# Patient Record
Sex: Male | Born: 1950 | Race: White | Hispanic: No
Health system: Southern US, Academic
[De-identification: ages and names within clinical notes are randomized; demographics above are authoritative.]

## PROBLEM LIST (undated history)

## (undated) DIAGNOSIS — J449 Chronic obstructive pulmonary disease, unspecified: Secondary | ICD-10-CM

## (undated) DIAGNOSIS — M1990 Unspecified osteoarthritis, unspecified site: Secondary | ICD-10-CM

## (undated) DIAGNOSIS — E039 Hypothyroidism, unspecified: Secondary | ICD-10-CM

## (undated) DIAGNOSIS — K759 Inflammatory liver disease, unspecified: Secondary | ICD-10-CM

## (undated) HISTORY — PX: LAMINECTOMY: SHX219

## (undated) HISTORY — PX: TONSILLECTOMY: SUR1361

## (undated) HISTORY — PX: SPINAL FUSION: SHX223

---

## 1997-04-28 ENCOUNTER — Other Ambulatory Visit (HOSPITAL_COMMUNITY): Payer: Self-pay | Admitting: NEUROSURGERY

## 1998-10-01 ENCOUNTER — Emergency Department (HOSPITAL_COMMUNITY): Admission: EM | Admit: 1998-10-01 | Discharge: 1998-10-01 | Payer: Self-pay | Admitting: Emergency Medicine

## 1998-12-09 ENCOUNTER — Emergency Department (HOSPITAL_COMMUNITY): Admission: EM | Admit: 1998-12-09 | Discharge: 1998-12-09 | Payer: Self-pay | Admitting: Emergency Medicine

## 1998-12-10 ENCOUNTER — Emergency Department (HOSPITAL_COMMUNITY): Admission: EM | Admit: 1998-12-10 | Discharge: 1998-12-10 | Payer: Self-pay | Admitting: *Deleted

## 2000-05-21 HISTORY — PX: ELBOW SURGERY: SHX618

## 2000-07-06 ENCOUNTER — Emergency Department (HOSPITAL_COMMUNITY): Payer: Self-pay | Admitting: Emergency Medicine

## 2001-01-22 ENCOUNTER — Emergency Department (HOSPITAL_COMMUNITY): Admission: EM | Admit: 2001-01-22 | Discharge: 2001-01-22 | Payer: Self-pay | Admitting: Emergency Medicine

## 2002-01-29 ENCOUNTER — Encounter: Payer: Self-pay | Admitting: Emergency Medicine

## 2002-01-29 ENCOUNTER — Inpatient Hospital Stay (HOSPITAL_COMMUNITY): Admission: EM | Admit: 2002-01-29 | Discharge: 2002-01-30 | Payer: Self-pay | Admitting: Emergency Medicine

## 2002-01-30 ENCOUNTER — Encounter: Payer: Self-pay | Admitting: Internal Medicine

## 2002-02-06 ENCOUNTER — Emergency Department (HOSPITAL_COMMUNITY): Admission: EM | Admit: 2002-02-06 | Discharge: 2002-02-06 | Payer: Self-pay | Admitting: Emergency Medicine

## 2002-02-06 ENCOUNTER — Encounter: Payer: Self-pay | Admitting: Emergency Medicine

## 2002-03-01 ENCOUNTER — Encounter: Payer: Self-pay | Admitting: *Deleted

## 2002-03-01 ENCOUNTER — Inpatient Hospital Stay (HOSPITAL_COMMUNITY): Admission: EM | Admit: 2002-03-01 | Discharge: 2002-03-03 | Payer: Self-pay | Admitting: *Deleted

## 2002-03-02 ENCOUNTER — Encounter: Payer: Self-pay | Admitting: Internal Medicine

## 2002-03-03 ENCOUNTER — Inpatient Hospital Stay (HOSPITAL_COMMUNITY): Admission: EM | Admit: 2002-03-03 | Discharge: 2002-03-08 | Payer: Self-pay | Admitting: Psychiatry

## 2002-09-07 ENCOUNTER — Encounter: Payer: Self-pay | Admitting: Family Medicine

## 2002-09-07 ENCOUNTER — Ambulatory Visit (HOSPITAL_COMMUNITY): Admission: RE | Admit: 2002-09-07 | Discharge: 2002-09-07 | Payer: Self-pay | Admitting: Family Medicine

## 2002-09-12 ENCOUNTER — Emergency Department (HOSPITAL_COMMUNITY): Admission: EM | Admit: 2002-09-12 | Discharge: 2002-09-12 | Payer: Self-pay | Admitting: Emergency Medicine

## 2002-12-29 ENCOUNTER — Encounter: Payer: Self-pay | Admitting: Neurosurgery

## 2002-12-29 ENCOUNTER — Inpatient Hospital Stay (HOSPITAL_COMMUNITY): Admission: RE | Admit: 2002-12-29 | Discharge: 2003-01-02 | Payer: Self-pay | Admitting: Neurosurgery

## 2003-02-08 ENCOUNTER — Ambulatory Visit (HOSPITAL_COMMUNITY): Admission: RE | Admit: 2003-02-08 | Discharge: 2003-02-08 | Payer: Self-pay | Admitting: Family Medicine

## 2003-02-08 ENCOUNTER — Encounter: Payer: Self-pay | Admitting: Family Medicine

## 2003-05-08 ENCOUNTER — Emergency Department (HOSPITAL_COMMUNITY): Admission: EM | Admit: 2003-05-08 | Discharge: 2003-05-08 | Payer: Self-pay | Admitting: Emergency Medicine

## 2003-09-29 ENCOUNTER — Ambulatory Visit (HOSPITAL_COMMUNITY): Admission: RE | Admit: 2003-09-29 | Discharge: 2003-09-29 | Payer: Self-pay | Admitting: Family Medicine

## 2003-12-28 ENCOUNTER — Ambulatory Visit (HOSPITAL_COMMUNITY): Admission: RE | Admit: 2003-12-28 | Discharge: 2003-12-28 | Payer: Self-pay | Admitting: Orthopedic Surgery

## 2003-12-30 ENCOUNTER — Encounter (HOSPITAL_COMMUNITY): Admission: RE | Admit: 2003-12-30 | Discharge: 2004-01-29 | Payer: Self-pay | Admitting: Orthopedic Surgery

## 2004-01-28 ENCOUNTER — Emergency Department (HOSPITAL_COMMUNITY): Admission: EM | Admit: 2004-01-28 | Discharge: 2004-01-28 | Payer: Self-pay | Admitting: Emergency Medicine

## 2004-03-11 ENCOUNTER — Emergency Department (HOSPITAL_COMMUNITY): Admission: EM | Admit: 2004-03-11 | Discharge: 2004-03-11 | Payer: Self-pay | Admitting: Emergency Medicine

## 2004-05-08 ENCOUNTER — Ambulatory Visit (HOSPITAL_COMMUNITY): Admission: RE | Admit: 2004-05-08 | Discharge: 2004-05-08 | Payer: Self-pay | Admitting: Family Medicine

## 2004-07-10 ENCOUNTER — Emergency Department (HOSPITAL_COMMUNITY): Admission: EM | Admit: 2004-07-10 | Discharge: 2004-07-10 | Payer: Self-pay | Admitting: Emergency Medicine

## 2004-07-12 ENCOUNTER — Ambulatory Visit: Payer: Self-pay | Admitting: Orthopedic Surgery

## 2004-07-14 ENCOUNTER — Ambulatory Visit: Payer: Self-pay | Admitting: Orthopedic Surgery

## 2004-07-14 ENCOUNTER — Ambulatory Visit (HOSPITAL_COMMUNITY): Admission: RE | Admit: 2004-07-14 | Discharge: 2004-07-14 | Payer: Self-pay | Admitting: Orthopedic Surgery

## 2004-07-17 ENCOUNTER — Ambulatory Visit: Payer: Self-pay | Admitting: Orthopedic Surgery

## 2004-07-27 ENCOUNTER — Ambulatory Visit: Payer: Self-pay | Admitting: Orthopedic Surgery

## 2004-07-30 ENCOUNTER — Emergency Department (HOSPITAL_COMMUNITY): Admission: EM | Admit: 2004-07-30 | Discharge: 2004-07-30 | Payer: Self-pay | Admitting: Emergency Medicine

## 2004-08-05 ENCOUNTER — Emergency Department (HOSPITAL_COMMUNITY): Admission: EM | Admit: 2004-08-05 | Discharge: 2004-08-05 | Payer: Self-pay | Admitting: Emergency Medicine

## 2004-08-24 ENCOUNTER — Ambulatory Visit: Payer: Self-pay | Admitting: Orthopedic Surgery

## 2004-10-10 ENCOUNTER — Encounter (HOSPITAL_COMMUNITY): Admission: RE | Admit: 2004-10-10 | Discharge: 2004-11-09 | Payer: Self-pay | Admitting: Family Medicine

## 2004-11-13 ENCOUNTER — Emergency Department (HOSPITAL_COMMUNITY): Admission: EM | Admit: 2004-11-13 | Discharge: 2004-11-13 | Payer: Self-pay | Admitting: Emergency Medicine

## 2004-11-14 ENCOUNTER — Ambulatory Visit: Payer: Self-pay | Admitting: Internal Medicine

## 2004-11-16 ENCOUNTER — Ambulatory Visit (HOSPITAL_COMMUNITY): Admission: RE | Admit: 2004-11-16 | Discharge: 2004-11-16 | Payer: Self-pay | Admitting: Internal Medicine

## 2004-11-20 ENCOUNTER — Ambulatory Visit: Payer: Self-pay | Admitting: Orthopedic Surgery

## 2004-11-30 ENCOUNTER — Ambulatory Visit (HOSPITAL_COMMUNITY): Admission: RE | Admit: 2004-11-30 | Discharge: 2004-11-30 | Payer: Self-pay | Admitting: Internal Medicine

## 2004-11-30 ENCOUNTER — Encounter (INDEPENDENT_AMBULATORY_CARE_PROVIDER_SITE_OTHER): Payer: Self-pay | Admitting: Internal Medicine

## 2004-11-30 ENCOUNTER — Ambulatory Visit: Payer: Self-pay | Admitting: Internal Medicine

## 2004-12-03 ENCOUNTER — Emergency Department (HOSPITAL_COMMUNITY): Admission: EM | Admit: 2004-12-03 | Discharge: 2004-12-03 | Payer: Self-pay | Admitting: Emergency Medicine

## 2004-12-31 ENCOUNTER — Emergency Department (HOSPITAL_COMMUNITY): Admission: EM | Admit: 2004-12-31 | Discharge: 2004-12-31 | Payer: Self-pay | Admitting: Emergency Medicine

## 2005-01-04 ENCOUNTER — Ambulatory Visit: Payer: Self-pay | Admitting: Internal Medicine

## 2005-02-11 ENCOUNTER — Emergency Department (HOSPITAL_COMMUNITY): Admission: EM | Admit: 2005-02-11 | Discharge: 2005-02-11 | Payer: Self-pay | Admitting: Emergency Medicine

## 2005-04-04 ENCOUNTER — Ambulatory Visit (HOSPITAL_COMMUNITY): Admission: RE | Admit: 2005-04-04 | Discharge: 2005-04-04 | Payer: Self-pay | Admitting: Family Medicine

## 2005-04-11 ENCOUNTER — Ambulatory Visit: Payer: Self-pay | Admitting: Internal Medicine

## 2005-04-11 ENCOUNTER — Emergency Department (HOSPITAL_COMMUNITY): Admission: EM | Admit: 2005-04-11 | Discharge: 2005-04-11 | Payer: Self-pay | Admitting: Emergency Medicine

## 2005-04-15 ENCOUNTER — Emergency Department (HOSPITAL_COMMUNITY): Admission: EM | Admit: 2005-04-15 | Discharge: 2005-04-15 | Payer: Self-pay | Admitting: Emergency Medicine

## 2005-05-25 ENCOUNTER — Emergency Department (HOSPITAL_COMMUNITY): Admission: EM | Admit: 2005-05-25 | Discharge: 2005-05-25 | Payer: Self-pay | Admitting: Emergency Medicine

## 2005-06-21 ENCOUNTER — Ambulatory Visit: Payer: Self-pay | Admitting: Internal Medicine

## 2005-06-30 ENCOUNTER — Emergency Department (HOSPITAL_COMMUNITY): Admission: EM | Admit: 2005-06-30 | Discharge: 2005-06-30 | Payer: Self-pay | Admitting: Emergency Medicine

## 2005-07-12 ENCOUNTER — Emergency Department (HOSPITAL_COMMUNITY): Admission: EM | Admit: 2005-07-12 | Discharge: 2005-07-12 | Payer: Self-pay | Admitting: Emergency Medicine

## 2005-07-13 ENCOUNTER — Ambulatory Visit: Payer: Self-pay | Admitting: Gastroenterology

## 2005-07-15 ENCOUNTER — Emergency Department (HOSPITAL_COMMUNITY): Admission: EM | Admit: 2005-07-15 | Discharge: 2005-07-15 | Payer: Self-pay | Admitting: Emergency Medicine

## 2005-07-25 ENCOUNTER — Ambulatory Visit: Payer: Self-pay | Admitting: Internal Medicine

## 2005-08-06 ENCOUNTER — Ambulatory Visit: Payer: Self-pay | Admitting: Internal Medicine

## 2005-08-15 ENCOUNTER — Ambulatory Visit: Payer: Self-pay | Admitting: Internal Medicine

## 2005-09-13 ENCOUNTER — Ambulatory Visit: Payer: Self-pay | Admitting: Internal Medicine

## 2005-09-27 ENCOUNTER — Ambulatory Visit: Payer: Self-pay | Admitting: Internal Medicine

## 2005-10-26 ENCOUNTER — Emergency Department (HOSPITAL_COMMUNITY): Admission: EM | Admit: 2005-10-26 | Discharge: 2005-10-26 | Payer: Self-pay | Admitting: Emergency Medicine

## 2005-11-02 ENCOUNTER — Ambulatory Visit (HOSPITAL_COMMUNITY): Admission: RE | Admit: 2005-11-02 | Discharge: 2005-11-02 | Payer: Self-pay | Admitting: Family Medicine

## 2005-12-16 ENCOUNTER — Emergency Department (HOSPITAL_COMMUNITY): Admission: EM | Admit: 2005-12-16 | Discharge: 2005-12-16 | Payer: Self-pay | Admitting: Emergency Medicine

## 2006-01-16 ENCOUNTER — Ambulatory Visit: Payer: Self-pay | Admitting: Internal Medicine

## 2006-01-25 ENCOUNTER — Emergency Department (HOSPITAL_COMMUNITY): Admission: EM | Admit: 2006-01-25 | Discharge: 2006-01-25 | Payer: Self-pay | Admitting: Emergency Medicine

## 2006-03-08 ENCOUNTER — Emergency Department (HOSPITAL_COMMUNITY): Admission: EM | Admit: 2006-03-08 | Discharge: 2006-03-08 | Payer: Self-pay | Admitting: Emergency Medicine

## 2006-03-11 ENCOUNTER — Ambulatory Visit (HOSPITAL_COMMUNITY): Admission: RE | Admit: 2006-03-11 | Discharge: 2006-03-11 | Payer: Self-pay | Admitting: Family Medicine

## 2006-04-09 ENCOUNTER — Ambulatory Visit (HOSPITAL_COMMUNITY): Admission: RE | Admit: 2006-04-09 | Discharge: 2006-04-09 | Payer: Self-pay | Admitting: Neurosurgery

## 2006-05-02 ENCOUNTER — Inpatient Hospital Stay (HOSPITAL_COMMUNITY): Admission: RE | Admit: 2006-05-02 | Discharge: 2006-05-03 | Payer: Self-pay | Admitting: Neurosurgery

## 2006-06-17 ENCOUNTER — Emergency Department (HOSPITAL_COMMUNITY): Admission: EM | Admit: 2006-06-17 | Discharge: 2006-06-17 | Payer: Self-pay | Admitting: Emergency Medicine

## 2006-08-01 ENCOUNTER — Ambulatory Visit: Payer: Self-pay | Admitting: Internal Medicine

## 2006-11-30 ENCOUNTER — Emergency Department (HOSPITAL_COMMUNITY): Admission: EM | Admit: 2006-11-30 | Discharge: 2006-11-30 | Payer: Self-pay | Admitting: Emergency Medicine

## 2007-01-09 ENCOUNTER — Observation Stay (HOSPITAL_COMMUNITY): Admission: EM | Admit: 2007-01-09 | Discharge: 2007-01-10 | Payer: Self-pay | Admitting: Emergency Medicine

## 2007-01-14 ENCOUNTER — Ambulatory Visit: Payer: Self-pay | Admitting: Internal Medicine

## 2007-03-20 ENCOUNTER — Emergency Department (HOSPITAL_COMMUNITY): Admission: EM | Admit: 2007-03-20 | Discharge: 2007-03-20 | Payer: Self-pay | Admitting: Emergency Medicine

## 2007-04-30 ENCOUNTER — Ambulatory Visit (HOSPITAL_COMMUNITY): Admission: RE | Admit: 2007-04-30 | Discharge: 2007-04-30 | Payer: Self-pay | Admitting: Family Medicine

## 2007-05-02 ENCOUNTER — Ambulatory Visit (HOSPITAL_COMMUNITY): Admission: RE | Admit: 2007-05-02 | Discharge: 2007-05-02 | Payer: Self-pay | Admitting: Family Medicine

## 2007-05-20 ENCOUNTER — Emergency Department (HOSPITAL_COMMUNITY): Admission: EM | Admit: 2007-05-20 | Discharge: 2007-05-20 | Payer: Self-pay | Admitting: Emergency Medicine

## 2007-06-06 ENCOUNTER — Inpatient Hospital Stay (HOSPITAL_COMMUNITY): Admission: EM | Admit: 2007-06-06 | Discharge: 2007-06-13 | Payer: Self-pay | Admitting: Emergency Medicine

## 2007-06-14 ENCOUNTER — Emergency Department (HOSPITAL_COMMUNITY): Admission: EM | Admit: 2007-06-14 | Discharge: 2007-06-14 | Payer: Self-pay | Admitting: Emergency Medicine

## 2007-08-30 ENCOUNTER — Emergency Department (HOSPITAL_COMMUNITY): Admission: EM | Admit: 2007-08-30 | Discharge: 2007-08-30 | Payer: Self-pay | Admitting: Emergency Medicine

## 2007-10-07 ENCOUNTER — Ambulatory Visit: Payer: Self-pay | Admitting: Internal Medicine

## 2007-10-24 ENCOUNTER — Emergency Department (HOSPITAL_COMMUNITY): Admission: EM | Admit: 2007-10-24 | Discharge: 2007-10-24 | Payer: Self-pay | Admitting: Emergency Medicine

## 2007-12-14 ENCOUNTER — Emergency Department (HOSPITAL_COMMUNITY): Admission: EM | Admit: 2007-12-14 | Discharge: 2007-12-14 | Payer: Self-pay | Admitting: Emergency Medicine

## 2007-12-28 ENCOUNTER — Emergency Department (HOSPITAL_COMMUNITY): Admission: EM | Admit: 2007-12-28 | Discharge: 2007-12-28 | Payer: Self-pay | Admitting: Emergency Medicine

## 2008-01-03 ENCOUNTER — Emergency Department (HOSPITAL_COMMUNITY): Admission: EM | Admit: 2008-01-03 | Discharge: 2008-01-03 | Payer: Self-pay | Admitting: Emergency Medicine

## 2008-01-18 ENCOUNTER — Emergency Department (HOSPITAL_COMMUNITY): Admission: EM | Admit: 2008-01-18 | Discharge: 2008-01-18 | Payer: Self-pay | Admitting: Emergency Medicine

## 2008-03-05 ENCOUNTER — Emergency Department (HOSPITAL_COMMUNITY): Admission: EM | Admit: 2008-03-05 | Discharge: 2008-03-05 | Payer: Self-pay | Admitting: Emergency Medicine

## 2008-05-01 ENCOUNTER — Emergency Department (HOSPITAL_COMMUNITY): Admission: EM | Admit: 2008-05-01 | Discharge: 2008-05-01 | Payer: Self-pay | Admitting: Emergency Medicine

## 2009-11-10 ENCOUNTER — Encounter (INDEPENDENT_AMBULATORY_CARE_PROVIDER_SITE_OTHER): Payer: Self-pay

## 2010-06-10 ENCOUNTER — Encounter: Payer: Self-pay | Admitting: Neurosurgery

## 2010-06-20 NOTE — Letter (Signed)
Summary: Recall Colonoscopy/Endoscopy, Change to Office Visit  [facility]  [address]   [place], [place] [address]   Phone: [phone]  Fax: [phone]      November 10, 2009   Samuel [name] [address] [place], [place]  [address] Nov 16, 1950   Dear Mr. Semel,   According to our records, it is time for you to schedule a Colonoscopy/Endoscopy. However, after reviewing your medical record, we recommend an office visit in order to determine your need for a repeat procedure.  Please call [phone] at your convenience to schedule an office visit. If you have any questions or concerns, please feel free to contact our office.   Sincerely,   Cloria Spring LPN  [facility] Ph: [phone]   Fax: [phone]  Appended Document: Recall Colonoscopy/Endoscopy, Change to Office Visit Letter returned, attempted, unknown.

## 2010-10-03 NOTE — Assessment & Plan Note (Signed)
[name], [name]                    CHART#:  [id number]   DATE:  01/14/2007                       DOB:  06-24-1950   1. Follow up history of HCV genotype 3 status post 1 month or so      combination therapy early 2007 with a sustained virologic response      as evidenced by negative quantitative PCR in March 2008.  2. History of anemia, probably secondary to antiviral therapy.  3. Interim improvement in FibroSure score pre and post treatment.  4. History of adenomatous polyp left colon 2007.  He will be due for      followup exam in 4 years.  He has also positive family history of      colon cancer.   Mr. Samuel [name] returns today.  He had cervical spine surgery back in December  2007.  He has done fairly well.  He did have LFTs done back in March and  they were all completely normal, along with a PCR.  Discussed the  implications of a negative PCR 1 year after therapy and it is impressive  to note that he appears to have eradicated the infection with a  relatively brief period of antiviral therapy.  He does not drink  alcohol.   CURRENT MEDICATIONS:  Are reviewed.  They are listed in the chart.   ALLERGIES:  No known drug allergies.   EXAM TODAY:  He looks well.  Weight 160, height 5 feet 6 inches,  temperature 98.1, BP 150/98, pulse 80.  SKIN:  Warm and dry.  There is no jaundice or cutaneous stigmata of  chronic liver disease.  CHEST:  Lungs are clear to auscultation.  CARDIOVASCULAR:  Regular rate and rhythm without murmur, gallop, rub.  ABDOMEN:  Flat, positive bowel sounds, soft, nontender, without  appreciable mass or organomegaly.  It is notable his weight is up 8  pounds since his March 2008 visit.   ASSESSMENT:  1. History of hepatitis C genotype 3 with eradication of infection      with a brief course of antiviral therapy, interim improvement in      fibrosis score.  2. History of elevated TSH that needs follow with history of anemia.  3. History of colonic adenoma which  deserves followup colonoscopy in 4      years.   We will go ahead and repeat a TSH, obtain a CBC, pro time and LFTs today  to see where we stand.  Will be back in touch with Mr. Samuel [name] when those  results are available for review.  Further recommendations to follow.       Samuel [name], M.D.  Electronically Signed     RMR/MEDQ  D:  01/14/2007  T:  01/14/2007  Job:  [id number]   cc:   Melvyn Novas, MD

## 2010-10-03 NOTE — Discharge Summary (Signed)
NAMEHERMANN, Samuel [name]                   ACCOUNT NO.:  [id number]   MEDICAL RECORD NO.:  [id number]          PATIENT TYPE:  OBV   LOCATION:  A220                          FACILITY:  [facility]   PHYSICIAN:  Melvyn Novas, MDDATE OF BIRTH:  1950/05/30   DATE OF ADMISSION:  01/08/2007  DATE OF DISCHARGE:  08/22/2008LH                               DISCHARGE SUMMARY   The patient is a 60 year old white male with multiple severe orthopedic  problems including a lumbar four-level fusion as well as a two-level  fusion in the cervical area, who is in chronic pain.  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  Essentially the patient was standing in line and had a syncopal episode  which was sudden, no antecedent symptoms or palpitations, no syncope or  diaphoresis or nausea.   He was seen and evaluated in the hospital.  There were no EKG changes,  no hemodynamic instability.  He was hypotensive and it was thought to be  possible volume depletion; however, his renal profile was not terribly  indicative of intravascular depletion.  He was seen.  His magnesium  level was normal, potassium, electrolytes, sodium and so forth.  His TSH  was normal.  He had no significant cardiac murmurs.  He was subsequently  released after 36 hours to be followed up in the office in 2-3 days'  time for a possible echocardiogram and exercise functional evaluation.   His discharge medicines include:  1. OxyContin 40 mg q.12h.  2. Neurontin 300 mg q.i.d.  3. Clonidine is discontinued.  4. Synthroid 300 mcg per day.  5. Percocet 5/325 mg one p.o. t.i.d. p.r.n. for breakthrough pain.  6. Ativan 1 mg p.o. h.s.  7. Folic acid 1 mg per day.  8. Ferrous sulfate 325 mg per day.   The patient was not anemic during this hospitalization.  He will follow  up in the office in 2 days' time.      Melvyn Novas, MD  Electronically Signed     RMD/MEDQ  D:  01/10/2007   T:  01/11/2007  Job:  [id number]

## 2010-10-03 NOTE — Assessment & Plan Note (Signed)
NAMEMarland [name]  Samuel, [name]                    CHART#:  [id number]   DATE:  10/07/2007                       DOB:  March 23, 1951   He did not keep his appointment on July 17, 2007.  1. I last saw him January 14, 2007.  2. History of HCV genotype III status post a short course of antiviral      therapy with apparent sustained biologic response with a negative      PCR in March 2008.  History of anemia probably secondary to      treatment for #1.  3. History of colonic adenoma in 2006 for surveillance 2011.  4. History of nausea, probably delayed gastric emptying (endoscopic      observation) felt be secondary narcotics.  5. History of gastroesophageal reflux disease.  The patient has done      well from GI standpoint.  Occasional nausea but he also has reflux      symptoms which is not treated without any acid suppression.  He is      asking for a refill and Phenergan.  No odynophagia, no dysphagia.      No actual vomiting.  No melena, hematochezia.  He tells me he may      have another back surgery in the works in the near future.  Some of      his hardware reportedly has come loose from prior surgery.  6. He has a history of anemia.  Hemoglobin in the 9.9 range back in      March 2008.   CURRENT MEDICATIONS:  See updated list.   ALLERGIES:  Known drug allergies.   EXAM:  Today appears his baseline.  Weight 167 which is up 7 pounds, height 5 feet 6 inches, temperature  98.2, BP 140/98, pulse 110 by me.  SKIN:  Warm and dry.  CHEST:  Lungs are clear to auscultation.  CARDIAC:  Regular rate and rhythm without murmur, gallop, or rub.  ABDOMEN:  Nondistended, positive bowel sounds, soft, nontender, no  succussion splash.  No hepatosplenomegaly.   ASSESSMENT:  1. Genotype HCV genotype III apparently eradicated.  Will go ahead and      check 1 more qualitative HCV today and repeat LFTs.  2. History of anemia secondary to treatment for HCV.  Will check a CBC      today.  3. Colonoscopy for  2011.  4. Intermittent nausea and gastroesophageal reflux disease.  Would      rather treat for gastroesophageal reflux with the use of acid      suppression daily rather than give him Phenergan p.r.n.  He may      have an element of delayed gastric emptying.  If we can get his      symptoms under control with acid suppression that would be the way      to go with less morbidity over the long haul.  To this end, we will      give him some Aciphex 20 mg orally daily.  Samples through the      office.  Will see how he does in 1 month.  If he likes that agent,      will continue on it.  Will follow up on the labs when they become      available.  I will make further recommendations in the very near      future.       Jonathon Bellows, M.D.  Electronically Signed     RMR/MEDQ  D:  10/07/2007  T:  10/07/2007  Job:  [id number]   cc:   Melvyn Novas, MD

## 2010-10-03 NOTE — Discharge Summary (Signed)
[name], Samuel [name]                   ACCOUNT NO.:  [id number]   MEDICAL RECORD NO.:  [id number]          PATIENT TYPE:  INP   LOCATION:  A203                          FACILITY:  [facility]   PHYSICIAN:  Melvyn Novas, MDDATE OF BIRTH:  Nov 18, 1950   DATE OF ADMISSION:  06/06/2007  DATE OF DISCHARGE:  LH                               DISCHARGE SUMMARY   The patient is a 60 year old white male with a history of black lung  disease.  He worked in the [employer] for 15 years.  He came in with  essentially a pneumonia.  He has very significant tachypnea, tachycardia  and hypoxia.  He was treated with dual antibiotics, Rocephin and  Zithromax.  He was given IV steroids and improved over a 5-6 day period.  There was no clinical or laboratory evidence of heart failure.  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  He also has a history of  hypothyroidism.   While in hospital, he steadily improved.  However, the major lab  abnormality was depressed TSH.  He had been on Synthroid 300 mg daily  for several years.  This was normal in the office several months ago and  we will decrease his Synthroid to 200 mcg daily.  He was discharged on  Xopenex nebulizer which he has at home q.i.d., Levaquin Leva-Pak 750  p.o. daily for 5 days, Sterapred dose-pack 10 mg for 12 days.  He will  follow up in the office in 3 days' time on Monday morning.      Melvyn Novas, MD  Electronically Signed     RMD/MEDQ  D:  06/12/2007  T:  06/13/2007  Job:  [id number]

## 2010-10-03 NOTE — H&P (Signed)
Samuel [name], Samuel [name]                   ACCOUNT NO.:  [id number]   MEDICAL RECORD NO.:  [id number]          PATIENT TYPE:  INP   LOCATION:  IC07                          FACILITY:  [facility]   PHYSICIAN:  Kingsley Callander. Ouida Sills, MD       DATE OF BIRTH:  06-25-1950   DATE OF ADMISSION:  06/06/2007  DATE OF DISCHARGE:  LH                              HISTORY & PHYSICAL   CHIEF COMPLAINT:  Difficulty breathing.   HISTORY OF PRESENT ILLNESS:  This patient is a 60 year old white male  who presented to the emergency room with acutely worsening shortness of  breath.  He had had some recent cold-type symptoms over the last few  days but this morning became much more acutely short of breath.  When  brought to the emergency room, he was found to be tachycardic,  tachypneic and hypoxic.  The patient has a history of black lung disease  and COPD.  He is a former Forensic scientist.  He has coughed up minimal sputum.  He denies fever or hemoptysis.   PAST MEDICAL HISTORY:  1. COPD/black lung disease.  2. Five low back surgeries.  3. Two neck surgeries.  4. Left ulnar nerve transposition.  5. Thyroidectomy.  6. Rheumatic fever as a child   MEDICATIONS:  1. Neurontin 600 mg q.i.d.  2. Synthroid 300 mcg daily.  3. Percocet usually 2 per day.  4. Celebrex 200 mg daily.  5. Ferrous sulfate daily.  6. Folic acid daily.  7. OxyContin 40 mg b.i.d.   ALLERGIES:  None.   FAMILY HISTORY:  His mother has had coronary artery disease.  His father  died of black lung disease.   REVIEW OF SYSTEMS:  Noncontributory.   PHYSICAL EXAMINATION:  VITAL SIGNS:  Temperature 97.8, respirations 36,  pulse 135, blood pressure 102/55.  GENERAL:  A dyspneic alert white male.  HEENT:  No scleral icterus.  Pharynx reveals dentures.  NECK:  No JVD, thyromegaly or lymphadenopathy.  LUNGS:  Diminished breath sounds with bilateral wheezes.  HEART:  Tachycardic with no audible murmurs.  ABDOMEN:  Nontender.  No hepatosplenomegaly or mass.  EXTREMITIES:  No cyanosis, clubbing or edema.  No calf tenderness or  swelling.  NEURO:  Intact.  SKIN:  Warm and dry.  LYMPH NODES:  No cervical or supraclavicular enlargement.   LABORATORY DATA:  The chest x-ray reveals no acute infiltrate.  A CT  scan of the chest was obtained, which reveals nodular-type infiltrates  which appear consistent with acute infection.  ABG reveals a pH of 7.38,  pCO2 23, pO2 of 54.9.  BNP 47.  Troponin I less than 0.05.  The EKG  reveals sinus tachycardia at 144 beats per minute.  White count 12.5,  hemoglobin 15.2, platelets 497,000.  Sodium 134, potassium 3.9, bicarb  18, glucose 151, BUN 28, creatinine 1.06, calcium 9.1.   IMPRESSION:  1. Pneumonia/chronic obstructive pulmonary disease exacerbation.  The      patient had one prior episode of respiratory failure approximately      five years ago.  He required  mechanical ventilation for two days      then.  Blood cultures have been obtained.  He will be treated with      IV Rocephin and IV azithromycin.  He will be treated with Ventolin      and Atrovent nebulizer treatments and IV Solu-Medrol.  He will be      initially treated in the intensive care unit in light of his poor      oxygenation on a 100% nonrebreather and his initial marked      tachycardia.  He has shown initial improvement, however, and may be      readily transferred to the floor as he improves.  2. Hypothyroidism status post thyroid surgery.  3. Chronic back pain.  Continue OxyContin and Percocet.  Will hold      Celebrex.  4. His iron can be stopped with his normal hemoglobin.      Kingsley Callander. Ouida Sills, MD  Electronically Signed     ROF/MEDQ  D:  06/06/2007  T:  06/07/2007  Job:  [id number]   cc:   Delbert Harness, MD

## 2010-10-03 NOTE — H&P (Signed)
NAMEJOHNCARLO, MAALOUF                   ACCOUNT NO.:  [id number]   MEDICAL RECORD NO.:  [id number]          PATIENT TYPE:  OBV   LOCATION:  A220                          FACILITY:  [facility]   PHYSICIAN:  Melvyn Novas, MDDATE OF BIRTH:  04-19-1951   DATE OF ADMISSION:  01/08/2007  DATE OF DISCHARGE:  LH                              HISTORY & PHYSICAL   HISTORY OF PRESENT ILLNESS:  The patient is a 60 year old white male  with a long history of chronic pain, musculoskeletal and so forth.  He  has been feeling well. Yesterday, he felt somewhat dizzy during the day.  He denied any antecedent palpitations or dizziness, nausea, vomiting or  chest pain.  He was standing in line waiting to get some food and had a  sudden syncopal episode.  He was found and seen in the ER.  Cardiac  enzymes and so forth were negative and normal.  All blood and  electrolytes are within normal limits.  He continues to be hypotensive  throughout the day, despite IV fluids.  Consideration is given to  narcotic analgesia versus vagal dysfunction, autonomic insufficiency or  adrenal insufficiency.  We will obtain a 12-lead cardiomegaly, serum  magnesium level and fasting a.m. cortisol levels.   PAST MEDICAL HISTORY:  1. Chronic pain.  2. Hypothyroidism.  3. Degenerative joint disease.  4. Hepatitis C, currently not on therapy.   PAST SURGICAL HISTORY:  1. Four-level lumbosacral fusion.  2. Three-level cervical fusion.  3. Thyroidectomy 12 years ago.  4. Carpal tunnel syndrome, left hand, and entrapment of ulnar nerve      operation approximately 12 months ago.   CURRENT MEDICINES:  1. OxyContin 40 mg q.12 h.  2. Neurontin 300 mg four times a day.  3. Clonidine 0.2 mg b.i.d.  4. Synthroid 300 mcg per day.  5. Percocet 5/325 mg one tab p.o. t.i.d. p.r.n.  6. Celebrex 200 mg a day.  7. Ativan 1 mg nightly.  8. Folic acid 1 mg daily.  9. Ferrous sulfate 325 mg p.o. daily for history of iron  deficiency.   PHYSICAL EXAMINATION:  VITAL SIGNS:  Blood pressure is 96/52, no  orthostatic changes.  Pulse is 50 and regular.  He is afebrile.  Respiratory rate is 14.  HEENT:  Head:  Normocephalic, atraumatic.  Eyes:  PERRLA.  EOMs intact.  Sclerae clear.  Conjunctivae pink.  NECK:  No JVD, no carotid bruits, no thyromegaly, no thyroid bruits.  LUNGS:  Prolonged expiratory phase, no rales, wheeze or rhonchi  appreciable.  HEART:  Regular rhythm.  No murmurs, gallops, heaves, thrills or rubs.  ABDOMEN:  Soft and nontender.  Bowel sounds normoactive.  No guarding or  rebound.  No mass or organomegaly.  EXTREMITIES:  No clubbing, cyanosis, or edema.  NEUROLOGIC:  Cranial nerves II-XII grossly intact.  The patient is alert  and oriented.  Sensory and motor are 5/5 in all extremities.  Plantars  are downgoing.   IMPRESSION:  1. Sudden syncopal episode, etiology undetermined.  2. Persistent hypotension, etiology undetermined.  3. Bradycardia, consider chronotropic  incompetence.  4. Narcotic analgesia.  5. Hypothyroidism.  6. Chronic pain.   PLAN:  The plan is to admit, get 12-lead EKG, serum a.m. cortisol level,  serum magnesium level, increase IV fluid support and add normal saline  150 mL an hour and I will make further recommendations as the data base  expands.      Melvyn Novas, MD  Electronically Signed     RMD/MEDQ  D:  01/09/2007  T:  01/10/2007  Job:  [id number]

## 2010-10-06 NOTE — Op Note (Signed)
NAME:  Samuel [name], PAVON                   ACCOUNT NO.:  [id number]   MEDICAL RECORD NO.:  [id number]          PATIENT TYPE:  AMB   LOCATION:  DAY                           FACILITY:  [facility]   PHYSICIAN:  Lionel December, M.D.    DATE OF BIRTH:  1950/07/09   DATE OF PROCEDURE:  11/30/2004  DATE OF DISCHARGE:                                 OPERATIVE REPORT   PROCEDURE:  Colonoscopy with polypectomy followed by  esophagogastroduodenoscopy.   INDICATIONS:  Bronsen is a 60 year old Caucasian male who was recently  evaluated for elevated transaminases and noted to have genotype III  hepatitis C. His ultrasound has been normal. On workup, he was also noted to  have anemia which turned out to be secondary to iron-deficiency. His serum  iron was 14, TIBC 476, saturation 3%, ferritin was 10. Furthermore, he has  history of colonic polyps and family history of carcinoma. His last  colonoscopy was in [place], [place], in 2001 with removal of two adenomas.  He is undergoing colonoscopy unless a lesion is found to account for his  iron deficiency anemia. He will also undergo EGD. While he does not have any  stigmata of chronic liver disease, it would be nice to know that he does not  have varices. Procedure risks were reviewed with the patient, and informed  consent was obtained.   PREMEDICATION:  Demerol 25 mg IV, Versed 15 mg IV, Cetacaine spray for  pharyngeal topical anesthesia.   FINDINGS:  Procedures performed in endoscopy suite. The patient's vital  signs and O2 saturation were monitored during the procedure and remained  stable.   PROCEDURE #1:  Colonoscopy. The patient was placed in left lateral position.  Rectal examination performed. No abnormality noted on external or digital  exam. Olympus videoscope was placed in rectum and advanced under vision into  sigmoid colon beyond. Redundant colon with suboptimal prep. He had formed  stool scattered throughout his colon. The scope was advanced to  cecum which  was identified by appendiceal orifice, ileocecal valve. There was a single 6-  mm polyp at descending colon which was snared and retrieved for histologic  examination. Mucosa of the rest of the colon was normal. Rectal mucosa  similarly was normal. Scope was retroflexed to examine the anorectal  junction which was unremarkable. Endoscope was withdrawn and was decided to  proceed with EGD.   Esophagogastroduodenoscopy. Olympus videoscope was passed via oropharynx  without any difficulty into esophagus.   Esophagus. Mucosa of the esophagus was normal. GE junction unremarkable.   Stomach. It had a large amount of food debris. Therefore examination was  incomplete. However, pyloric channel was patent. I was able to see the  angularis and cardia which were unremarkable.   Duodenum. Bulbar mucosa was normal. Scope was passed to the second part of  the duodenum where mucosa and folds were normal. Endoscope was withdrawn.   The patient tolerated the procedures well.   FINAL DIAGNOSIS:  Single 6-mm polyp snared from descending colon.   Normal examination of esophagus. Examination of the stomach was very limited  as was full of food debris with a patent pylorus. Normal exam of the bulb  and postbulbar duodenum. 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 I feel narcotic therapy would also explain very low  gallbladder ejection fraction.   RECOMMENDATIONS:  1.  Clear liquid diet for 2 days.  2.  Reglan 10 mg before each meal and bedtime.  3.  Ferrous sulfate 325 mg p.o. b.i.d.  4.  The patient will have CBC and LFTs repeated in one month. As far as his      hepatitis C is concerned, we will monitor him for the next few months      before decision is made regarding the liver biopsy and/or therapy. With      his chronic pain syndrome which is poorly controlled, he may not be an      ideal candidate for therapy for HCV.       NR/MEDQ  D:   11/30/2004  T:  11/30/2004  Job:  [id number]

## 2010-10-06 NOTE — Op Note (Signed)
NAMERAYNE, LOISEAU                   ACCOUNT NO.:  [id number]   MEDICAL RECORD NO.:  [id number]          PATIENT TYPE:  INP   LOCATION:  3172                         FACILITY:  [facility]   PHYSICIAN:  Clydene Fake, M.D.  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   PROCEDURE:  Anterior cervical decompression and diskectomy and fusion at  C5-6 and C6-7 with placement of allograft bone Eagle anterior cervical  plate.   SURGEON:  Dr. Phoebe Perch   ASSISTANT:  Dr. Jeral Fruit   ANESTHESIA:  General endotracheal tube anesthesia.   BLOOD LOSS:  None.   BLOOD REPLACED:  None.   DRAINS:  None.   REASON FOR PROCEDURE:  The patient is a 60 year old, gentleman who has  had neck, left shoulder, and left arm pain and numbness.  MRI was done  showing spondylitic changes multiple levels worse at 5-6 and 6-7 level  with disk herniation left-sided at 5-6 and severe spondylosis at 6-7.  The patient brought in for decompression and fusion.   PROCEDURE IN DETAIL:  The patient was brought to the operating room and  general anesthesia induced.  The patient was placed in 10 pounds halter  traction and prepped and draped in a sterile fashion.  Site of incision  was injected 10 mL of lidocaine with __________. An incision was then  made in left side of the neck from the midline to the anterior border of  the sternocleidomastoid muscle.  Incision taken down to the platysma and  hemostasis obtained with Bovie cauterization.  The platysma was incised  with Bovie and blunt dissection taken to the anterior cervical fascia of  the anterior cervical spine. Needle was placed in interspace and x-ray  was obtained and showed we were at the 5-6 interspace.  Disk  space was  incised with 15 blade and partial diskectomy performed.  Longus coli  muscles were reflected laterally from C5-C7 on both sides and self-  retaining retractor was placed.  Distraction pins were placed in C5 and  C7 interspace distracted 5-6 and 6-7 disk space were incised and  diskectomy continued with pituitary rongeurs.  Microscope was brought in  for microdissection.  Diskectomy was continued at 5-6 level with  curettes, pituitary rongeurs and then 1-2 mm Kerrison punches were used  to remove posterior disk, posterior osteophytes, posterior ligaments and  decompress the central canal and doing bilateral foraminotomies and  decompression nerve roots. There were severe spondylitic changes to the  right side at 5-6 and large disk herniation, left side. When we were  finished we had good decompression of central canal and bilateral nerve  roots.  We used high-speed drill to remove the cartilaginous endplates,  measured the disk space to be 6 mm.  We got hemostasis with Gelfoam and  thrombin and then 6 mm allograft bone was  tapped in place and  countersunk a couple of millimeters.  Attention was taken to 6-7 and  diskectomies continued with pituitary rongeurs and curettes, 1-2 mm  Kerrison punches were used to remove posterior disk, osteophytes, and  ligament. We decompressed the central canal and lateral foramen and when  we were finished we had good central and lateral decompression. Nerve  roots were decompressed.  We used high-speed drill to remove  cartilaginous endplates, measured the disk space to be 6 mm. After  getting hemostasis with Gelfoam and thrombin I tapped in a 6 mm Lifenet  allograft bone.  Bone was in good position. Distraction and distraction  pins were removed.  Hemostasis obtained with Gelfoam and thrombin.  Weights were removed from the traction and Eagle anterior cervical plate  was placed over the anterior cervical spine, two screws placed in the C5  and  two in C6 and two in C7.  These were tightened down.  Lateral x-ray  was obtained showing good position of bone plug, plate and screws at the  5-6 and 6-7 levels.  The retractors removed.  We irrigated with  antibiotic solution.  We had very good hemostasis and the platysma was  closed with 3-0 Vicryl interrupted sutures.  Subcutaneous tissue closed  with the same. Skin closed with benzoin, Steri-Strips. Dressing was  placed.  The patient was placed in a soft cervical collar, awakened from  anesthesia, and transferred to recovery room in stable condition.           ______________________________  Clydene Fake, M.D.     JRH/MEDQ  D:  05/02/2006  T:  05/02/2006  Job:  [id number]
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
## 2010-10-06 NOTE — Discharge Summary (Signed)
   NAME:  Samuel [name], Samuel [name]                             ACCOUNT NO.:  [id number]   MEDICAL RECORD NO.:  [id number]                   PATIENT TYPE:  INP   LOCATION:  3020                                 FACILITY:  [facility]   PHYSICIAN:  Coletta Memos, M.D.                  DATE OF BIRTH:  04/08/1951   DATE OF ADMISSION:  12/29/2002  DATE OF DISCHARGE:  01/02/2003                                 DISCHARGE SUMMARY   ADMISSION/PREOPERATIVE DIAGNOSES:  1. Recurrent lumbar stenosis.  2. Degenerative disc disease.  3. Spondylosis L4-5, L5-S1.   DISCHARGE DIAGNOSES:  1. Recurrent lumbar stenosis.  2. Degenerative disc disease.  3. Spondylosis L4-5, L5-S1.   PROCEDURES:  1. Redo decompressive laminectomy L4-5, L5-S1.  2. Posterior lumbar interbody fusion L4-5, L5-S1.  3. Brantigan interbody cages L4-5, L5-S1.  4. Myo pedicle screw placement L4-5, L5-S1.  5. Posterolateral arthrodesis L4-S1.   COMPLICATIONS:  None.   DISCHARGE STATUS:  Alive and well.   Mr. Samuel [name] presented to Dr. Phoebe Perch with significant pain in the lower back.  As a result of findings on MRI and examination, he felt that the above-  mentioned procedures would be his best therapeutic option.  Mr. Samuel [name] was  admitted, taken to the operating room and has had an uncomplicated  postoperative course.  He was discharged home today with Percocet,  OxyContin, Robaxin, and Valium.  I also gave him Xanax, which he says he  takes but will be not be able to get to his family doctor to get a  prescription within the next 2 weeks.  Given instructions:  No bending,  lifting, or twisting.  No driving.  He will see Dr. Phoebe Perch in approximately  3 to 4 weeks.                                                  Coletta Memos, M.D.    KC/MEDQ  D:  01/02/2003  T:  01/02/2003  Job:  [id number]

## 2010-10-06 NOTE — Op Note (Signed)
NAMEMARCELLOUS, Samuel [name]                   ACCOUNT NO.:  [id number]   MEDICAL RECORD NO.:  [id number]          PATIENT TYPE:  AMB   LOCATION:  DAY                           FACILITY:  [facility]   PHYSICIAN:  Vickki Hearing, M.D.DATE OF BIRTH:  18-Feb-1951   DATE OF PROCEDURE:  07/14/2004  DATE OF DISCHARGE:  07/14/2004                                 OPERATIVE REPORT   PREOPERATIVE DIAGNOSES:  1.  Pain and paresthesias, left elbow and hand.  2.  Cubital tunnel syndrome.   POSTOPERATIVE DIAGNOSES:  1.  Pain and paresthesias, left elbow and hand.  2.  Cubital tunnel syndrome.   PROCEDURE:  Reexploration of ulnar nerve and retransposition anteriorly of  the ulnar nerve.   INDICATIONS FOR PROCEDURE:  Continued pain and paresthesias despite  transposition of the ulnar nerve several months prior. Repeat nerve studies  showed continued pressure on the nerve just proximal to the elbow.   PROCEDURE NOTE:  The patient was identified in the preoperative holding  area. His history and physical was updated. His left upper extremity was  marked as the surgical site over his elbow, countersigned by the surgeon.  Preoperative antibiotics were given. He was taken to the operating room for  anesthetic. After anesthesia was satisfactorily achieved, his left upper  extremity was prepped and draped using sterile technique. A time out was  done, completed as required, and his left elbow was incised over the  previous incision. The ulnar nerve was dissected free. It appeared to have  slipped from its previous position back towards the medial epicondyle. It  was retransposed anteriorly. Three sutures were used to tack the skin down  over the nerve. Nerve was freed proximally and distally, proximal to the  under musculature septum and distal in the muscle of the flexor pronator  group. The wound was irrigated, closed in layered fashion with 2-0 Monocryl  and staples. Injected Marcaine and placed sterile  dressings. A sling was  applied. The patient was then taken to recovery room in stable condition.      SEH/MEDQ  D:  08/11/2004  T:  08/11/2004  Job:  [id number]
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
## 2010-10-06 NOTE — Op Note (Signed)
NAME:  GURKIRAT, BASHER                             ACCOUNT NO.:  [id number]   MEDICAL RECORD NO.:  [id number]                   PATIENT TYPE:  AMB   LOCATION:  DAY                                  FACILITY:  [facility]   PHYSICIAN:  Vickki Hearing, M.D.           DATE OF BIRTH:  [birth year]   DATE OF PROCEDURE:  12/28/2003  DATE OF DISCHARGE:                                 OPERATIVE REPORT   PREOPERATIVE DIAGNOSIS:  Cubital tunnel syndrome, left upper extremity.   POSTOPERATIVE DIAGNOSIS:  Cubital tunnel syndrome, left upper extremity.   PROCEDURE:  Subcutaneous transposition left ulnar nerve.   INDICATIONS FOR PROCEDURE:  Nerve compression at the elbow, left upper  extremity documented by nerve conduction study.   HISTORY:  This is a 60 year old male who was sent to me by Dr. Delbert Harness for  evaluation of left ulnar nerve symptoms.  He had tingling and numbness in  his small and ring finger of his left hand associated with weakness and  inability to perform routine activities of daily living.  His nerve  conduction study was positive for cubital tunnel syndrome with compression  just above the elbow.  He was treated with steroids, anti-inflammatories and  neurolytic agents did not improve and presented for surgery.   DETAILS OF PROCEDURE:  The patient was identified as Constance Hackenberg in the  preoperative holding area.  Chart review was done.  His left elbow was  marked as the surgical site by the patient and signed by the surgeon with my  initials.  He was given Ancef, taken to the operating room for general  anesthetic.  After sterile prep and drape, a time out was taken to confirm  the procedure, extremity and patient identity.  The entire OR staff agreed  and we proceeded with a curvilinear incision over the, centered over the  medial epicondyle.  This was extended 8 cm proximal to the medial epicondyle  and 5 cm distal.  The subcutaneous tissue was divided with blunt and sharp  dissection, preserving the subcutaneous nerves.  The ulnar nerve was found  in the cubital tunnel and released proximally to the intermuscular septum  and distally to the muscle belly of the forearm musculature.   The wound was irrigated, closed with 2-0 Vicryl and staples.  He was placed  in a posterior splint, extubated and taken to the recovery room in stable  condition.  He will be discharged to home and can follow up in two days in  the office.      ___________________________________________                                            Vickki Hearing, M.D.   SEH/MEDQ  D:  12/28/2003  T:  12/28/2003  Job:  [id number] 

## 2010-10-06 NOTE — H&P (Signed)
NAME:  Samuel [name], Samuel [name]                             ACCOUNT NO.:  [id number]   MEDICAL RECORD NO.:  [id number]                   PATIENT TYPE:  IPS   LOCATION:  0505                                 FACILITY:  [facility]   PHYSICIAN:  Geoffery Lyons, M.D.                   DATE OF BIRTH:  08-29-1950   DATE OF ADMISSION:  03/03/2002  DATE OF DISCHARGE:                         PSYCHIATRIC ADMISSION ASSESSMENT   IDENTIFYING INFORMATION:  This is a 60 year old divorced white male,  voluntary admission.   HISTORY OF PRESENT ILLNESS:  This patient was transferred from [facility] after treatment for a drug overdose of apparently Xanax and Soma.  His sister had found him early in the morning and was unable to awaken him  and called 911.  The patient denies any memory of actually taking an  overdose and denies any specific suicidal intent but endorses depressed mood  and admits that he frequently abused his medications and took more or less  at various times.  He did state that he had thoughts of not wanting to  think about anything.  This following the death of his brother  approximately two weeks ago from liver failure.  The patient endorses  anhedonia, sadness and anergy for the past 2-3 weeks, coinciding with his  brother's illness and death.  He has been sleeping only with increased use  of his medications.  He also endorses abuse of pain medications since his  back injury in 1986 and reports that he has been taking up to 35 tablets of  Soma daily and up to 35 tablets of OxyContin daily for the past 2-3 years.   PAST PSYCHIATRIC HISTORY:  The patient has never been treated for depression  in the past.  He does have a history of one prior admission to [facility]  [facility] [place] for treatment of opiate addiction.  This admission taking place  apparently early in 2003.  He reports, at that time, he stayed completely  clean and off medications for approximately two months and then relapsed on  the  medications and has been abusing these ever since.  This is his first  admission to [facility].  His longest period clean,  since beginning to abuse medications in 1986, is unclear.  He denies any  other substance abuse.  Denies any other substance abuse.  Denies any  history of psychosis or depression prior to that injury in 1986.   SOCIAL HISTORY:  The patient is a Furniture conservator/restorer.  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  He was married approximately 28  years and has grown children.  He has now been divorced for the past two  years, stating the drugs ruined everything.  He now lives with his sister  [place]  He is on social security disability  and  receives a check of $1200 every month along with a pension from his previous  employer.  The patient has a current speeding ticket that is pending but has  no other current legal charges.   FAMILY HISTORY:  Remarkable for a brother with a history of alcohol abuse.   ALCOHOL/DRUG HISTORY:  The patient's urine drug screen was positive for both  benzodiazepines and opiates.  Opiate abuse is detailed above.  The patient  reports that he occasionally uses benzodiazepines that he has obtained off  the streets but he seems unclear about his usage pattern and is generally  minimizing this but does admit to using them on a fairly regular basis.  He  denies any history of alcohol abuse.   PAST MEDICAL HISTORY:  The patient has no clear primary care [facility].  He  reports he has been going from doctor to doctor to obtain pain medications.  He has not had a complete evaluation of his back related to his pain problem  in more than two years.  Medical problems include chronic back pain status  post ruptured lumbar disk in February of 1986 and several surgeries and  nerve blocks subsequent to the injury.  Hypothyroidism, GERD.  No history of  seizures.  Past medical history is  also remarkable for a thyroidectomy in  1975.   MEDICATIONS:  Synthroid 200 mcg, abuse of Xanax, Soma and OxyContin prior to  admission.  Medications, upon transfer, were Effexor 37.5 mg, 1 q.h.s.,  Duragesic patch 100 mcg q.72h. placed on March 02, 2002, Protonix 40 mg  p.o. q.d., Lexapro 20 mg daily, Neurontin 600 mg t.i.d., albuterol 2.5 mg  q.i.d. per nebulizer, Synthroid 200 mcg tablet p.o. q.d.   ALLERGIES:  None.  The patient does report that VISTARIL sometimes causes  him to have muscle cramps but this is a somewhat vague complaint in the  origin of it, when he actually experienced this is unclear.   POSITIVE PHYSICAL FINDINGS:  The patient's physical examination was done by  Dr. Letitia Libra at [facility] on admission up there to the unit.  He  was actually intubated there and medically stabilized.  Today, we do note a  well-healed scar on his lumbar spine.  He is generally quite mobile within  the bed, getting up and down, able to handle his ADLs independently.  Vital  signs, on admission to the unit, were temperature 99.3, pulse 81,  respirations 21, blood pressure 149/84.   REVIEW OF SYSTEMS:  Denial of any bowel or bladder control problems or  muscular weakness.  His strength is 5/5 in all extremities.   LABORATORY DATA:  Very mild normocytic anemia with hemoglobin of 12.4,  hematocrit 37.0, RBCs 4.11, RDW 17.5, MCV 89.9, within normal limits.  His  electrolytes were within normal limits.  BUN 12, creatinine 0.7.  Benzodiazepines were positive as were opiates in his urine drug screen.   MENTAL STATUS EXAM:  This is a generally well-nourished, well-developed,  healthy-appearing male, who is fully alert with a flattened affect and some  mild motor slowing.  He is cooperative, mildly tremulous and slightly  anxious.  Speech is clear and normal.  Mood is depressed and anxious. Thought processes are logical without deficits.  No evidence of psychosis or  homicidal  ideation.  He is quite depressed and his suicidal ideation seems  unclear, although he has no active intent for suicide.  He is able to  promise safety on the unit.  It is clear that he is depressed and would  prefer to withdraw and not participate in life.  His thought content is  dominated by his perceived role as an invalid and he seems unable to think  beyond that or to visualize any goals for himself beyond his sick role.  Cognitively, he is intact and oriented x 3.  Insight is poor.  Judgment fair  to poor.  Impulse control fair to poor.   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  2. Opiate abuse and dependence.  3. 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   AXIS II:  Deferred.   AXIS III:  1. Status post benzodiazepine and opiate overdose.  2. Status post intubation.  3. Gastroesophageal reflux disease.  4. Hypothyroidism.  5. Chronic back pain.   AXIS IV:  Severe (problems related to substance abuse).   AXIS V:  Current 30; this past year 9.   PLAN:  Voluntarily admit the patient to detox him from benzodiazepines and  to treat withdrawal symptoms from opiates, although we will continue him on  a Duragesic patch.  He has been taking considerable amounts.  We will  initiate a clonidine protocol to withdraw from the opiates and also place  him on a phenobarbital protocol because of his previous use of Xanax.  Our  goal is also to lift his depression and alleviate his suicidal ideation and,  hopefully, strengthen his coping mechanisms and situate him in such a way  that he is able to function in independent living with his activities of  daily living.  We will check a thyroid panel and repeat a CBC since he does  have a very slightly elevated temperature.  We will monitor that.  Meanwhile, we will continue him on Duragesic patch 100 mcg to be changed  q.72h., Neurontin 600 mg t.i.d., Effexor 37.5 mg and Lexapro 20 mg daily and  gauge his  response from there.   ESTIMATED LENGTH OF STAY:  Seven days.     Margaret A. Scott, N.P.                   Geoffery Lyons, M.D.    MAS/MEDQ  D:  03/04/2002  T:  03/04/2002  Job:  [id number]

## 2010-10-06 NOTE — Op Note (Signed)
NAME:  Samuel [name], Samuel [name]                             ACCOUNT NO.:  [id number]   MEDICAL RECORD NO.:  [id number]                   PATIENT TYPE:  INP   LOCATION:  3020                                 FACILITY:  [facility]   PHYSICIAN:  Clydene Fake, M.D.               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   OPERATION PERFORMED:  Redo decompressive laminectomy L4-5 and L5-S1,  posterior lumbar interbody fusion L4-5 and L5-S1 with Brantigan interbody  cages at both L4-5 and L5-S1, cemented Monarch pedicle screw placement L4, 5  and S1.  Posterolateral fusion L4 through S1 (2 levels), autograft same  incision, allograft, Symphony system.   SURGEON:  Clydene Fake, M.D.   ASSISTANT:  Payton Doughty, M.D.   ANESTHESIA:  General endotracheal.   ESTIMATED BLOOD LOSS:  .  Cell Saver blood given.   INDICATIONS FOR PROCEDURE:  The patient is a 60 year old gentleman who has  been through three prior lumbar surgeries with chronic back and leg pain.  Over the last few months, the pain has worsened, worsening back and right  leg pain.  Patient worked up with MRI and x-rays of the lumbar spine showing  severe spondylosis L4-5 and L5-S1 worse at 4-5 with postoperative changes of  scarring and compression of the L4 and L5 roots could be seen on either side  of L4-L5.  Presents for operative decompression and fusion.   DESCRIPTION OF PROCEDURE:  Patient supine on the operating table, general  anesthesia induced.  Patient placed in prone position with all pressure  points padded.  Patient was prepped and draped in sterile fashion.  Site  of  incision was injected with 5mL of 1% lidocaine with epinephrine.  Incision  was then made at site of the previous incision in the lower lumbar spine.  The incision was increased in size from cephalad and caudad just slightly.  Incision taken down to fascia.  Hemostasis obtained with Bovie cautery.  Fascia was incised with the Bovie on subperiosteal dissection to reveal L3,  4, 5, and S1 spinous process.  __________ facets bilaterally and transverse  processes of L4 and 5 levels and lateral sacrum were then  exposed.  Fluoroscopic imaging was used to confirm position, and a lot of scarring was  seen bilaterally at the  L4-5 and 5-1 interspaces. A laminectomy was then  performed using Leksell rongeurs along with Kerrison punches removing the  lamina of L4 through top of the sacrum.  Medial facetectomies were performed  at L4-5 and L5-S1 and there was significant scar  tissue that was stuck to  dura.  We were able to dissect through that gaining good decompression.  We  then continued dissection after removing the facet and hypertrophic ligament  decompressing the nerve roots going up to the lateral disk space at L4-6 and  L5-S1 bilaterally.  We then explored the 4-5 disk space which was severely  spondylitic and there was no disk space there. There were some osteophytes.  These were then drilled to enter the disk space and we were able to distract  the interspace and distract it up using interspace distractors bilaterally  opening this up to 9 mm.  Diskectomy performed removing all disk.  We  scraped the end plates and used a broach from the Brantigan cages system to  remove the cartilaginous end plates.  We scraped the endplates with curets  and the rest of the disk space.  All the bone that was removed, was cleaned  from the soft tissue, chopped into small pieces using a bone mallet and then  put through the Symphony system with he platelet rich plasma log.  The rest  of platelet rich  plasma was used to form allograft bone.  This autograft  bone was then packed into the two Brantigan cages __________ and this bone  was packed in the interspace and then the cages were tapped into place first  on one side and then on the other while holding distraction on the opposite  side.  __________ a few millimeters.  Attention was turned to the L5-1  interspace and diskectomy was performed with pituitary rongeurs and curets.  The interspace was distracted up to 9 mm.  We prepared the interspace for  Brantigan cage fusion using the scrapers and the broach, packing the  interspace with autograft bone, packed two cages, __________ Michel Santee cages  and allograft bone and tapped them in place while holding distraction on the  opposite site first on the right and then placed one on the left.  When we  were finished, we had good realignment of the spine with good decompression  of the nerve roots.  We again explored the roots of 4, 5 an S1 roots to make  sure that they were decompressed and then foraminotomies were done to ensure  that they were decompressed.  Attention was then taken to spinous processes  and lateral facets and those were decorticated bilaterally with high speed  drill.  The rest of our autograft and allograft bone was mixed together and  packed in the posterolateral gutters for posterolateral fusion at L4-S1.  We  then checked our pedicle points using fluoroscopy as a guide.  __________  decorticate pedicle probe was placed down the pedicle.  Pedicle was tapped  and then held with __________ to make sure the bone wedges were all the way  around and tapped__________ end plates.  This process was first done on the  right side at  L4, L5 and S1 pedicles and then on the left.  45 mm screws x  6.25 Monarch screws were used on the L4, 5 and 40 mm long screws right side  at S1 and 35 mm screw on the left side and 40 mm screw and all six were diamond screws.  Rod was placed on top  of the screws and locking caps were  placed and one locking cap was tightened with distraction on the interspace  the next one was tightened with distraction on the interspace the next was  tightened.  This  was done on the right side and the left side.  We then  placed the rod, placed the locking caps and tightened the caps down.  When  this was done, AP and lateral fluoroscopic imaging was obtained showing good  position and alignment of the spine.  Good position of all the  instrumentation and to the lateral spine. The dura was irrigated with  antibiotic saline solutions and platelet rich plasma was then  sprayed on  the paraspinous muscles and on the dura.  Retractors were then remove.  The  paraspinous muscles closed with 0 Vicryl interrupted suture.  The fascia was  closed with 0 Vicryl interrupted suture.  The subcutaneous tissue was closed  with 2-0 and 3-0 Vicryl interrupted suture.  Skin closed with Benzoin and  Steri-Strips.  Dry dressing was placed.  Patient was placed back in supine  position, awakened from anesthesia and transferred to recovery room in  stable condition.                                                Clydene Fake, M.D.    JRH/MEDQ  D:  12/29/2002  T:  12/29/2002  Job:  [id number]

## 2010-10-06 NOTE — H&P (Signed)
Samuel [name], PERCIVAL                   ACCOUNT NO.:  [id number]   MEDICAL RECORD NO.:  [id number]          PATIENT TYPE:  AMB   LOCATION:                                 FACILITY:   PHYSICIAN:  Vickki Hearing, M.D.DATE OF BIRTH:  1951/02/10   DATE OF ADMISSION:  DATE OF DISCHARGE:  02/20/2006LH                                HISTORY & PHYSICAL   CHIEF COMPLAINT:  Pain and paresthesias of the left upper extremity.   HISTORY OF PRESENT ILLNESS:  This is a 60 year old male who is status post a  left ulnar nerve transposition anteriorly on December 28, 2003. He did not do  well in the postoperative period. He developed a mild superficial wound  infection, elbow contractures, and hand contractures, and had persistent  pain and paresthesias of the hand. He has had weakness and this has been  progressive. His repeat nerve study on June 15, 2004, showed severe left  ulnar nerve slowing across the elbow, and mildly at the wrist with abductor  digiti minimi and first dorsal interosseous affected. Clinically, this is  borne out as well.   He has been evaluated by Dr. Phoebe Perch despite his cervical disk with MRI  documented lesion at C5-6, and mild disk bulge at C6-7. He did recommend  that the nerve either be re-explored or a repeat nerve study be done later  in April.   However, because of the patient's severe pain and progressive weakness and  wasting, it is felt that he should undergo nerve exploration at this time.   REVIEW OF SYSTEMS:  Thyroid disease, poor vision, seasonal allergies.   PAST MEDICAL HISTORY:  Thyroid disease, low back pain status post four disk  surgeries. He also had thyroidectomy.   MEDICATIONS:  Neurontin, Percocet, Zyrtec, Paxil, clonidine, and Synthroid.   FAMILY HISTORY:  Heart and lung disease.   SOCIAL HISTORY:  He is disabled from the coal mines. He does not smoke or  drink. He is single.   PHYSICAL EXAMINATION:  VITAL SIGNS:  Weight 155, pulse 74,  respiratory rate  20.  GENERAL:  He will well developed, thin, ectomorphic in habitus, grooming and  hygiene are normal. His nutritional status appears adequate.  CARDIOVASCULAR:  No swelling or varicosities.  LYMPH:  No axillary or epitrochlear lymph nodes.  SKIN:  Well-healed incision over his left elbow. It is intact with no signs  of infection.  MUSCULOSKELETAL:  A 5-degree lack of active extension at the elbow, full  flexion. Pronation and supination are normal. Passive range of motion is  normal.   He has weakness and wasting in his first dorsal interosseous compartment.  There is also tenderness just proximal to the elbow and somewhat also at the  intermuscular septum. The nerve is palpable in the subcutaneous tissue just  anterior to the incision, appearing to have migrated posteriorly from its  transposed position.   He has decreased sensation in the small and ring finger with weakness of the  ulnar nerve innervated musculature.   PSYCHOLOGICAL:  He is awake, alert, and oriented x3. His  mood is pleasant  and he is somewhat anxious with a small tremor.   DIAGNOSIS:  Cubital tunnel syndrome.   PLAN:  Reexploration of the ulnar nerve of the left upper extremity.      SEH/MEDQ  D:  07/12/2004  T:  07/12/2004  Job:  [id number]   cc:   Delbert Harness, MD

## 2010-10-06 NOTE — Discharge Summary (Signed)
NAME:  Samuel [name], Samuel [name]                             ACCOUNT NO.:  [id number]   MEDICAL RECORD NO.:  [id number]                   PATIENT TYPE:  IPS   LOCATION:  0505                                 FACILITY:  [facility]   PHYSICIAN:  Jeanice Lim, M.D.              DATE OF BIRTH:  Jul 11, 1950   DATE OF ADMISSION:  03/03/2002  DATE OF DISCHARGE:  03/08/2002                                 DISCHARGE SUMMARY   IDENTIFYING DATA:  This is a 60 year old divorced white male, voluntarily  admitted, transferred from [facility] after a drug overdose, apparently on  Xanax and Soma.  Sister found him, unable to wake him, called 911.  The  patient reported depressed mood, had thoughts of not wanting to think about  anything and just going to sleep.  The patient had been treated for  depression in the past.   ADMISSION MEDICATIONS:  Synthroid, Xanax, Soma, OxyContin prior to  admission.  Medications on transfer from medical hospital Effexor XR,  Duragesic patch, Protonix, Lexapro, Neurontin, albuterol, Synthroid.   ALLERGIES:  VISTARIL.   PHYSICAL EXAMINATION:  Essentially within normal limits, neurologically  nonfocal.  The patient had actually been intubated there and medically  stabilized and has a well-healed scar on lumbar spine.  He is quite mobile,  able to handle ADLs and neurologically nonfocal.   ROUTINE ADMISSION LABS:  Mild normocytic anemia, hemoglobin and hematocrit  of 12.4 and 37 respectively.  Benzodiazapines  and opiates positive from  urine drug screen.   MENTAL STATUS EXAM:  Generally well-nourished, well-developed, healthy  appearing, fully alert.  Pleasant affect, some motor slowing, and  cooperative, tremulous, anxious.  Mood was depressed and restricted.  Thought process goal directed, thought content negative for dangerous  ideations or psychotic symptoms.  The patient's suicidal ideation was  somewhat vague, although contracting for safety.  Cognition was intact.  Judgment  and insight poor.   ADMISSION DIAGNOSES:   AXIS I:  1. Depressive disorder not otherwise specified.  2. Opiate abuse.  3. Benzodiazapine abuse rule out dependence.   AXIS II:  None.   AXIS III:  Status post benzodiazapine and opiate overdose, status post  intubation, gastroesophageal reflux disease, hypothyroidism and chronic back  pain.   AXIS IV:  Severe, related to medical problems and long history of substance  abuse.   AXIS V:  30/58.   HOSPITAL COURSE:  The patient was admitted and ordered routine p.r.n.  medications, underwent further monitoring, and was encouraged to participate  in individual, group and milieu therapy, was placed on the opiate withdrawal  protocol using Klonopin and resumed on Effexor, Fentanyl, Protonix, Lexapro,  Neurontin, albuterol, Synthroid.  The patient was placed as a high fall risk  due to multiple medications and also was placed on a phenobarbital detox  protocol for safe withdrawal with close monitoring.  The patient was taken  off of  the Duragesic patch as he was on an opiate withdrawal protocol, and  the patient was given Anaprox for pain.  The patient tolerated medical  interventions and reported a positive response.  Also participated in  substance abuse treatment.  He did minimize his substance abuse history and  showed improvement.   CONDITION ON DISCHARGE:  Markedly improved.  Mood was more euthymic and  stable, affect bright.  He was less anxious.  Thought process goal directed.  Thought content negative for dangerous ideation or psychotic symptoms.  He  denied any acute withdrawal symptoms, reported motivation to remain sober  and to follow up with aftercare planning.   DISCHARGE MEDICATIONS:  1. Effexor XR 75 mg q.6 p.m.  2. Protonix 40 mg q.a.m.  3. Lexapro 10 mg 2 q.a.m.  4. Ventolin q.i.d.  5. Levothyroxine 200 mcg q.a.m.  6. Neurontin 600 mg q.i.d.  7. Ambien 10 mg q.h.s. p.r.n.   DISPOSITION:  The patient was to  follow up with case management at  [facility] in [place] within 7-10 days.   DISCHARGE DIAGNOSES:   AXIS I:  1. Depressive disorder not otherwise specified.  2. Opiate abuse.  3. Benzodiazapine abuse rule out dependence.   AXIS II:  None.   AXIS III:  Status post benzodiazapine and opiate overdose, status post  intubation, gastroesophageal reflux disease, hypothyroidism and chronic back  pain.   AXIS IV:  Severe, related to medical problems and long history of substance  abuse.   AXIS V:  Global assessment of function on discharge was 50.                                                  Jeanice Lim, M.D.    JEM/MEDQ  D:  04/09/2002  T:  04/10/2002  Job:  [id number]

## 2010-10-06 NOTE — Discharge Summary (Signed)
NAME:  Samuel [name], Samuel [name]                             ACCOUNT NO.:  [id number]   MEDICAL RECORD NO.:  [id number]                   PATIENT TYPE:  INP   LOCATION:  IC03                                 FACILITY:  [facility]   PHYSICIAN:  Gracelyn Nurse, M.D.              DATE OF BIRTH:  [birth year]   DATE OF ADMISSION:  03/01/2002  DATE OF DISCHARGE:  03/03/2002                                 DISCHARGE SUMMARY   DISCHARGE DIAGNOSES:  1. Altered mental status.  2. Ventilator-dependent respiratory failure.  3. Chronic back pain.  4. Depression.     A. Suicide attempt.   DISCHARGE MEDICATIONS:  1. Synthroid 200 mcg q.d.  2. Zyrtec 10 mg q.d.  3. Nexium 40 mg q.d.  4. Effexor 37.5 mg q.d.  5. Hydroxyzine 50 mg t.i.d.  6. Neurontin 60 mg t.i.d.   REASON FOR ADMISSION:  This is a 60 year old white male who has a history of  depression and chronic pain.  He was brought to the emergency room by EMS  after the patient was found to be unresponsive.  He, apparently, had taken  too much of his pain medication.  The patient was intubated in the emergency  room due to lack of control of airway.   HOSPITAL COURSE:  1. Ventilator-dependent respiratory failure:  Again, the patient was     intubated because he could not control his airway because he was     unresponsive.  He spent one day on the ventilator and was able to wake up     the next day after the drugs were out of his system, responded     appropriately, and was quickly extubated.  2. Depression with possible suicide attempt:  The patient took too much of     his pain medication.  He does suffer from chronic pain and is very     depressed.  He was evaluated by the ACT team, and they recommended     inpatient therapy, and he was transferred to [facility] for     further treatment.  3. Chronic back pain:  He was given very little pain medication here in the     hospital.  He was managed with some small doses of IV morphine.  He  will     need long-term management of his chronic back pain.  I recommend that he     be referred to a pain management clinic.  4. Depression:  As stated above, he was seen by the ACT team, and they     recommended inpatient therapy, and he was transferred to [facility]    CONDITION ON DISCHARGE:  The patient was discharged in stable condition.   DISPOSITION:  [facility]ia [facility] to [facility].  Gracelyn Nurse, M.D.    JDJ/MEDQ  D:  04/03/2002  T:  04/03/2002  Job:  [id number]
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
## 2011-02-08 LAB — HEPATIC FUNCTION PANEL
ALT: 16
AST: 18
Bilirubin, Direct: 0.1
Total Bilirubin: 0.5

## 2011-02-08 LAB — BLOOD GAS, ARTERIAL
Acid-base deficit: 10 — ABNORMAL HIGH
Bicarbonate: 14 — ABNORMAL LOW
O2 Content: 1
O2 Saturation: 86.8
O2 Saturation: 90.2
Patient temperature: 37
TCO2: 12.3

## 2011-02-08 LAB — BASIC METABOLIC PANEL
CO2: 28
Calcium: 9.1
Calcium: 9.2
GFR calc Af Amer: >60
GFR calc Af Amer: >60
Glucose, Bld: 124 — ABNORMAL HIGH
Glucose, Bld: 151 — ABNORMAL HIGH
Potassium: 3.9
Sodium: 134 — ABNORMAL LOW

## 2011-02-08 LAB — CULTURE, BLOOD (ROUTINE X 2)
Culture: NO GROWTH
Culture: NO GROWTH
Report Status: 1212009

## 2011-02-08 LAB — DIFFERENTIAL
Basophils Absolute: 0.1
Basophils Relative: 0
Basophils Relative: 1
Eosinophils Absolute: 0
Eosinophils Absolute: 0
Eosinophils Relative: 0
Eosinophils Relative: 0
Lymphocytes Relative: 19
Monocytes Absolute: 0.4
Monocytes Absolute: 0.7
Neutro Abs: 8.7 — ABNORMAL HIGH
Neutro Abs: 9.7 — ABNORMAL HIGH
Neutrophils Relative %: 77

## 2011-02-08 LAB — D-DIMER, QUANTITATIVE: D-Dimer, Quant: 0.37

## 2011-02-08 LAB — B-NATRIURETIC PEPTIDE (CONVERTED LAB): Pro B Natriuretic peptide (BNP): 47.3

## 2011-02-08 LAB — CBC
HCT: 47.3
Hemoglobin: 12.1 — ABNORMAL LOW
Hemoglobin: 15.2
MCHC: 32.7
Platelets: 497 — ABNORMAL HIGH
RBC: 4.8
RDW: 16.8 — ABNORMAL HIGH

## 2011-02-28 LAB — DIFFERENTIAL
Basophils Absolute: 0.1
Basophils Relative: 1
Lymphocytes Relative: 23
Neutro Abs: 5.9
Neutrophils Relative %: 72

## 2011-02-28 LAB — BASIC METABOLIC PANEL
BUN: 13
Calcium: 8.5
Creatinine, Ser: 0.81
GFR calc non Af Amer: >60
Glucose, Bld: 104 — ABNORMAL HIGH
Sodium: 136

## 2011-02-28 LAB — CBC
Platelets: 292
RDW: 18.3 — ABNORMAL HIGH

## 2011-03-02 LAB — CBC
HCT: 38.3 — ABNORMAL LOW
MCV: 89.7
RBC: 4.27
WBC: 11.8 — ABNORMAL HIGH

## 2011-03-02 LAB — BASIC METABOLIC PANEL
Chloride: 106
Creatinine, Ser: 1.11
GFR calc Af Amer: >60
GFR calc non Af Amer: >60
Potassium: 3.5

## 2011-03-02 LAB — TSH: TSH: 0.048 — ABNORMAL LOW

## 2011-03-02 LAB — DIFFERENTIAL
Eosinophils Absolute: 0.2
Lymphocytes Relative: 28
Lymphs Abs: 3.2
Monocytes Relative: 7
Neutrophils Relative %: 63

## 2017-07-25 ENCOUNTER — Ambulatory Visit (HOSPITAL_COMMUNITY)
Admission: RE | Admit: 2017-07-25 | Discharge: 2017-07-25 | Disposition: A | Discharge status: Home / Self Care | Payer: Medicare (Managed Care) | Source: Ambulatory Visit | Attending: Family Medicine | Admitting: Family Medicine

## 2017-07-25 ENCOUNTER — Other Ambulatory Visit (HOSPITAL_COMMUNITY): Payer: Self-pay | Admitting: Family Medicine

## 2017-07-25 DIAGNOSIS — M4682 Other specified inflammatory spondylopathies, cervical region: Secondary | ICD-10-CM | POA: Insufficient documentation

## 2017-07-25 DIAGNOSIS — M5030 Other cervical disc degeneration, unspecified cervical region: Secondary | ICD-10-CM | POA: Insufficient documentation

## 2017-07-25 DIAGNOSIS — M47812 Spondylosis without myelopathy or radiculopathy, cervical region: Secondary | ICD-10-CM

## 2018-03-24 ENCOUNTER — Other Ambulatory Visit (HOSPITAL_COMMUNITY): Payer: Self-pay | Admitting: Family Medicine

## 2018-03-24 ENCOUNTER — Ambulatory Visit (HOSPITAL_COMMUNITY)
Admission: RE | Admit: 2018-03-24 | Discharge: 2018-03-24 | Disposition: A | Discharge status: Home / Self Care | Payer: Medicare HMO | Source: Ambulatory Visit | Attending: Family Medicine | Admitting: Family Medicine

## 2018-03-24 DIAGNOSIS — M1711 Unilateral primary osteoarthritis, right knee: Secondary | ICD-10-CM | POA: Diagnosis not present

## 2018-05-06 ENCOUNTER — Ambulatory Visit: Payer: Medicare HMO | Admitting: Orthopaedic Surgery

## 2018-05-06 ENCOUNTER — Encounter: Payer: Self-pay | Admitting: Orthopaedic Surgery

## 2018-05-06 DIAGNOSIS — M25561 Pain in right knee: Secondary | ICD-10-CM

## 2018-05-06 NOTE — Progress Notes (Signed)
Subjective:    Patient ID: Samuel [name], male    DOB: 24-Jan-1951, 67 y.o.   MRN: [id number]  HPI He has five to six week history of right knee pain with swelling, popping and more recently giving way.  He has been seen by Dr. Janna ArchonDiego for this. X-rays were done and showed DJD changes and effusion.  He has not improved.  He has more pain with the cold weather.  He has no trauma, no redness, no numbness.  He is post multiple back surgeries.   Review of Systems  Constitutional: Positive for activity change.  Musculoskeletal: Positive for arthralgias, back pain, gait problem and joint swelling.  All other systems reviewed and are negative.  For Review of Systems, all other systems reviewed and are negative.  The following is a summary of the past history medically, past history surgically, known current medicines, social history and family history.  This information is gathered electronically by the computer from prior information and documentation.  I review this each visit and have found including this information at this point in the chart is beneficial and informative.   History reviewed. No pertinent past medical history.  Past Surgical History:  Procedure Laterality Date  . ELBOW SURGERY  2002  . LAMINECTOMY    . SPINAL FUSION     has cervical fusion and lumbar fusion 6 lumbar surgeries and 3 cervical surgeries    Current Outpatient Medications on File Prior to Visit  Medication Sig Dispense Refill  . gabapentin (NEURONTIN) 600 MG tablet Take 600 mg by mouth 3 (three) times daily.  3  . levothyroxine (SYNTHROID, LEVOTHROID) 300 MCG tablet Take 300 mcg by mouth daily before breakfast.    . naproxen (NAPROSYN) 500 MG tablet Take 500 mg by mouth 2 (two) times daily.  5  . oxyCODONE (ROXICODONE) 15 MG immediate release tablet Take 15 mg by mouth every 6 (six) hours as needed for pain.    . traZODone (DESYREL) 100 MG tablet Take 100 mg by mouth at bedtime.     No current  facility-administered medications on file prior to visit.     Social History   Socioeconomic History  . Marital status: Divorced    Spouse name: Not on file  . Number of children: Not on file  . Years of education: Not on file  . Highest education level: Not on file  Occupational History  . Not on file  Social Needs  . Financial resource strain: Not on file  . Food insecurity:    Worry: Not on file    Inability: Not on file  . Transportation needs:    Medical: Not on file    Non-medical: Not on file  Tobacco Use  . Smoking status: Never Smoker  . Smokeless tobacco: Never Used  Substance and Sexual Activity  . Alcohol use: Not on file  . Drug use: Not on file  . Sexual activity: Not on file  Lifestyle  . Physical activity:    Days per week: Not on file    Minutes per session: Not on file  . Stress: Not on file  Relationships  . Social connections:    Talks on phone: Not on file    Gets together: Not on file    Attends religious service: Not on file    Active member of club or organization: Not on file    Attends meetings of clubs or organizations: Not on file    Relationship status: Not on  file  . Intimate partner violence:    Fear of current or ex partner: Not on file    Emotionally abused: Not on file    Physically abused: Not on file    Forced sexual activity: Not on file  Other Topics Concern  . Not on file  Social History Narrative  . Not on file    Family History  Problem Relation Age of Onset  . Heart disease Mother   . Lung disease Father     BP (!) 154/89   Pulse 84   Ht 5\' 5"  (1.651 m)   Wt 168 lb (76.2 kg)   BMI 27.96 kg/m   Body mass index is 27.96 kg/m.     Objective:   Physical Exam Constitutional:      Appearance: He is well-developed.  HENT:     Head: Normocephalic and atraumatic.  Eyes:     Conjunctiva/sclera: Conjunctivae normal.     Pupils: Pupils are equal, round, and reactive to light.  Neck:     Musculoskeletal:  Normal range of motion and neck supple.  Cardiovascular:     Rate and Rhythm: Normal rate and regular rhythm.  Pulmonary:     Effort: Pulmonary effort is normal.  Abdominal:     Palpations: Abdomen is soft.  Musculoskeletal:     Right knee: He exhibits swelling and effusion. Tenderness found. Medial joint line tenderness noted.       Legs:  Skin:    General: Skin is warm and dry.  Neurological:     Mental Status: He is alert and oriented to person, place, and time.     Cranial Nerves: No cranial nerve deficit.     Motor: No abnormal muscle tone.     Coordination: Coordination normal.     Deep Tendon Reflexes: Reflexes are normal and symmetric. Reflexes normal.  Psychiatric:        Behavior: Behavior normal.        Thought Content: Thought content normal.        Judgment: Judgment normal.           Assessment & Plan:   Encounter Diagnosis  Name Primary?  . Acute pain of right knee    I am concerned he has a tear of the medial meniscus of the right knee.  I will order a MRI of the knee.  PROCEDURE NOTE:  The patient requests injections of the right knee , verbal consent was obtained.  The right knee was prepped appropriately after time out was performed.   Sterile technique was observed and injection of 1 cc of Depo-Medrol 40 mg with several cc's of plain xylocaine. Anesthesia was provided by ethyl chloride and a 20-gauge needle was used to inject the knee area. The injection was tolerated well.  A band aid dressing was applied.  The patient was advised to apply ice later today and tomorrow to the injection sight as needed.  Continue his present medicine.  Return after the MRI is done.  Electronically Signed Darreld Mclean, MD 12/17/20198:28 AM

## 2018-05-08 ENCOUNTER — Telehealth: Payer: Self-pay | Admitting: Orthopaedic Surgery

## 2018-05-08 NOTE — Telephone Encounter (Signed)
Call received from Rudean CurtKendra, [facility], [phone] - states patient's CT needs prior authorization

## 2018-05-12 ENCOUNTER — Ambulatory Visit (HOSPITAL_COMMUNITY): Payer: Medicare HMO

## 2018-05-20 ENCOUNTER — Ambulatory Visit (HOSPITAL_COMMUNITY)
Admission: RE | Admit: 2018-05-20 | Discharge: 2018-05-20 | Disposition: A | Discharge status: Home / Self Care | Payer: Medicare HMO | Source: Ambulatory Visit | Attending: Orthopaedic Surgery | Admitting: Orthopaedic Surgery

## 2018-05-20 ENCOUNTER — Ambulatory Visit: Payer: Medicare HMO | Admitting: Orthopaedic Surgery

## 2018-05-20 DIAGNOSIS — M25561 Pain in right knee: Secondary | ICD-10-CM | POA: Insufficient documentation

## 2018-05-22 ENCOUNTER — Telehealth: Payer: Self-pay | Admitting: Orthopedic Surgery

## 2018-05-22 ENCOUNTER — Encounter: Payer: Self-pay | Admitting: Orthopaedic Surgery

## 2018-05-22 ENCOUNTER — Ambulatory Visit: Payer: Medicare HMO | Admitting: Orthopaedic Surgery

## 2018-05-22 VITALS — BP 154/84 | HR 89 | Ht 66.0 in | Wt 165.0 lb

## 2018-05-22 DIAGNOSIS — M25561 Pain in right knee: Secondary | ICD-10-CM | POA: Diagnosis not present

## 2018-05-22 DIAGNOSIS — S83241A Other tear of medial meniscus, current injury, right knee, initial encounter: Secondary | ICD-10-CM | POA: Diagnosis not present

## 2018-05-22 MED ORDER — HYDROCODONE-ACETAMINOPHEN 5-325 MG PO TABS: ORAL_TABLET | ORAL | 0 refills | Status: DC

## 2018-05-22 NOTE — Progress Notes (Signed)
Patient Samuel [name], male DOB:02/10/1951, 68 y.o. [id number]  Chief Complaint  Patient presents with  . Knee Pain    right   . Results    review MRI     HPI  Samuel [name] is a 68 y.o. male who has continued pain of the right knee. He had MRI which showed: IMPRESSION: 1. Radial tear of the posterior Flanigan of the medial meniscus with peripheral meniscal extrusion. Complex tear of the posterior Bjelland-body junction of the medial meniscus. 2. Small undersurface tear of the posterior Balint of the lateral meniscus. 3. Cartilage abnormalities of the medial and lateral femorotibial compartments as described above.  I have explained the findings to him.  He will need arthroscopy of the knee.  I will have him see Dr. Romeo Apple.  The patient is agreeable to this.   Body mass index is 26.63 kg/m.  ROS  Review of Systems  Constitutional: Positive for activity change.  Musculoskeletal: Positive for arthralgias, back pain, gait problem and joint swelling.  All other systems reviewed and are negative.   All other systems reviewed and are negative.  The following is a summary of the past history medically, past history surgically, known current medicines, social history and family history.  This information is gathered electronically by the computer from prior information and documentation.  I review this each visit and have found including this information at this point in the chart is beneficial and informative.    History reviewed. No pertinent past medical history.  Past Surgical History:  Procedure Laterality Date  . ELBOW SURGERY  2002  . LAMINECTOMY    . SPINAL FUSION     has cervical fusion and lumbar fusion 6 lumbar surgeries and 3 cervical surgeries    Family History  Problem Relation Age of Onset  . Heart disease Mother   . Lung disease Father     Social History Social History   Tobacco Use  . Smoking status: Never Smoker  . Smokeless tobacco: Never Used   Substance Use Topics  . Alcohol use: Not on file  . Drug use: Not on file    Not on File  Current Outpatient Medications  Medication Sig Dispense Refill  . gabapentin (NEURONTIN) 600 MG tablet Take 600 mg by mouth 3 (three) times daily.  3  . levothyroxine (SYNTHROID, LEVOTHROID) 300 MCG tablet Take 300 mcg by mouth daily before breakfast.    . naproxen (NAPROSYN) 500 MG tablet Take 500 mg by mouth 2 (two) times daily.  5  . traZODone (DESYREL) 100 MG tablet Take 100 mg by mouth at bedtime.    Marland Kitchen HYDROcodone-acetaminophen (NORCO/VICODIN) 5-325 MG tablet One tablet every four hours as needed for acute pain.  Limit of five days per [place] statue. 30 tablet 0   No current facility-administered medications for this visit.      Physical Exam  Blood pressure (!) 154/84, pulse 89, height 5\' 6"  (1.676 m), weight 165 lb (74.8 kg).  Constitutional: overall normal hygiene, normal nutrition, well developed, normal grooming, normal body habitus. Assistive device:none  Musculoskeletal: gait and station Limp right, muscle tone and strength are normal, no tremors or atrophy is present.  .  Neurological: coordination overall normal.  Deep tendon reflex/nerve stretch intact.  Sensation normal.  Cranial nerves II-XII intact.   Skin:   Normal overall no scars, lesions, ulcers or rashes. No psoriasis.  Psychiatric: Alert and oriented x 3.  Recent memory intact, remote memory unclear.  Normal mood  and affect. Well groomed.  Good eye contact.  Cardiovascular: overall no swelling, no varicosities, no edema bilaterally, normal temperatures of the legs and arms, no clubbing, cyanosis and good capillary refill.  Lymphatic: palpation is normal.  Right knee is tender, has small effusion, limp right, ROM 0 to 110, medial pain, positive medial McMurray.  NV intact.  All other systems reviewed and are negative   The patient has been educated about the nature of the problem(s) and counseled on treatment  options.  The patient appeared to understand what I have discussed and is in agreement with it.  Encounter Diagnoses  Name Primary?  . Acute pain of right knee Yes  . Acute medial meniscus tear of right knee, initial encounter     PLAN Call if any problems.  Precautions discussed.  Continue current medications. I will give pain medicine also.  Return to clinic To see Dr. Romeo Apple for possible right knee surgery.   I have reviewed the [place] Controlled Substance Reporting System web site prior to prescribing narcotic medicine for this patient.   Electronically Signed Darreld Mclean, MD 1/2/20208:20 AM

## 2018-05-23 ENCOUNTER — Encounter: Payer: Self-pay | Admitting: Orthopedic Surgery

## 2018-05-23 ENCOUNTER — Ambulatory Visit: Payer: Medicare HMO | Admitting: Orthopedic Surgery

## 2018-05-23 VITALS — BP 161/88 | HR 81 | Ht 66.0 in | Wt 165.0 lb

## 2018-05-23 DIAGNOSIS — M23203 Derangement of unspecified medial meniscus due to old tear or injury, right knee: Secondary | ICD-10-CM

## 2018-05-23 DIAGNOSIS — M5136 Other intervertebral disc degeneration, lumbar region: Secondary | ICD-10-CM | POA: Diagnosis not present

## 2018-05-23 DIAGNOSIS — Z981 Arthrodesis status: Secondary | ICD-10-CM

## 2018-05-23 DIAGNOSIS — M23300 Other meniscus derangements, unspecified lateral meniscus, right knee: Secondary | ICD-10-CM | POA: Diagnosis not present

## 2018-05-23 DIAGNOSIS — M1711 Unilateral primary osteoarthritis, right knee: Secondary | ICD-10-CM

## 2018-05-23 NOTE — Progress Notes (Signed)
PREOP CONSULT/REFERRAL INTRA-OFFICE FROM DR Gaylene BrooksJW KEELING   Chief Complaint  Patient presents with  . Knee Pain    right /surgical consult     68 year old male sent to me by Dr. Hilda LiasKeeling for pain in his right knee which he has had at least since December of last year.  He has a chronic history of lumbar fusion requiring oxycodone intermittently from his primary care doctor Dr. Janna Archondiego  He comes in today complaining of pain in his left knee medial joint line and posteriorly with radiation down into the foot which he says is been worse recently.  The patient reports no history of trauma but progressively increasing pain in the right knee with diffuse knee pain described as dull throbbing intermittent and worse with exercise and activity no improvement after injection oxycodone and hydrocodone     Review of Systems  Respiratory: Negative for cough.   Cardiovascular: Negative for chest pain.  Musculoskeletal: Positive for back pain and joint pain.  Neurological: Positive for tingling and sensory change.  All other systems reviewed and are negative.    History reviewed. No pertinent past medical history.  Past Surgical History:  Procedure Laterality Date  . ELBOW SURGERY  2002  . LAMINECTOMY    . SPINAL FUSION     has cervical fusion and lumbar fusion 6 lumbar surgeries and 3 cervical surgeries    Family History  Problem Relation Age of Onset  . Heart disease Mother   . Lung disease Father    Social History   Tobacco Use  . Smoking status: Never Smoker  . Smokeless tobacco: Never Used  Substance Use Topics  . Alcohol use: Not on file  . Drug use: Not on file    Not on File   Current Meds  Medication Sig  . gabapentin (NEURONTIN) 600 MG tablet Take 600 mg by mouth 3 (three) times daily.  Marland Kitchen. HYDROcodone-acetaminophen (NORCO/VICODIN) 5-325 MG tablet One tablet every four hours as needed for acute pain.  Limit of five days per [place] statue.  Marland Kitchen. levothyroxine  (SYNTHROID, LEVOTHROID) 300 MCG tablet Take 300 mcg by mouth daily before breakfast.  . naproxen (NAPROSYN) 500 MG tablet Take 500 mg by mouth 2 (two) times daily.  Marland Kitchen. oxycodone (OXY-IR) 5 MG capsule Take 5 mg by mouth every 4 (four) hours as needed.  . traZODone (DESYREL) 100 MG tablet Take 100 mg by mouth at bedtime.    BP (!) 161/88   Pulse 81   Ht 5\' 6"  (1.676 m)   Wt 165 lb (74.8 kg)   BMI 26.63 kg/m   Physical Exam Constitutional:      General: He is not in acute distress.    Appearance: He is well-developed. He is not diaphoretic.  HENT:     Head: Normocephalic and atraumatic.     Right Ear: External ear normal.     Left Ear: External ear normal.     Nose: Nose normal.  Eyes:     General:        Right eye: No discharge.        Left eye: No discharge.     Conjunctiva/sclera: Conjunctivae normal.     Pupils: Pupils are equal, round, and reactive to light.  Neck:     Musculoskeletal: Normal range of motion and neck supple.     Thyroid: No thyromegaly.     Trachea: No tracheal deviation.  Cardiovascular:     Rate and Rhythm: Normal rate and regular rhythm.  Pulmonary:     Effort: Pulmonary effort is normal. No respiratory distress.     Breath sounds: No stridor. No wheezing.  Chest:     Chest wall: No tenderness.  Abdominal:     General: There is no distension.     Palpations: Abdomen is soft. There is no mass.     Tenderness: There is no guarding.  Musculoskeletal:     Right knee: He exhibits no effusion.  Lymphadenopathy:     Cervical: No cervical adenopathy.  Skin:    General: Skin is warm and dry.     Capillary Refill: Capillary refill takes less than 2 seconds.  Neurological:     Mental Status: He is alert and oriented to person, place, and time.     Gait: Gait normal.     Deep Tendon Reflexes: Reflexes are normal and symmetric.  Psychiatric:        Behavior: Behavior normal.        Thought Content: Thought content normal.        Judgment: Judgment  normal.     Right Knee Exam   Muscle Strength  The patient has normal right knee strength.  Tenderness  The patient is experiencing tenderness in the medial joint line.  Range of Motion  Extension: normal  Flexion:  130 normal   Tests  McMurray:  Medial - positive Lateral - negative Varus: negative Valgus: negative Drawer:  Anterior - negative    Posterior - negative  Other  Erythema: absent Scars: absent Sensation: normal Pulse: present Swelling: none Effusion: no effusion present   Left Knee Exam   Muscle Strength  The patient has normal left knee strength.  Tenderness  The patient is experiencing no tenderness.   Range of Motion  Extension: normal  Flexion: normal   Tests  McMurray:  Medial - negative Lateral - negative Varus: negative Valgus: negative Drawer:  Anterior - negative     Posterior - negative  Other  Erythema: absent Scars: absent Sensation: normal Pulse: present Swelling: none      MEDICAL DECISION SECTION  Plain films show mild to moderate arthritis of the knee and MRI shows tear.  After reviewing the MRI and the report I agree that the he does have a radial tear of the posterior Ono of the medial meniscus with complex tear there is a small undersurface tear lateral meniscus and there are cartilage abnormalities including partial thickness loss of cartilage medial and patellofemoral area.  This include high-grade partial-thickness loss full-thickness loss medial side and lateral side showing partial thickness loss and full-thickness loss in the lateral compartment   Encounter Diagnoses  Name Primary?  . Primary osteoarthritis of right knee Yes  . Degenerative tear of medial meniscus of right knee   . Meniscus, lateral, derangement, right   . Degenerative disc disease, lumbar   . S/P lumbar spinal fusion      PLAN:   Surgical procedure planned: Arthroscopy right knee partial medial meniscectomy partial lateral  meniscectomy  I made a point to tell the patient that he will have some pain relief primarily medially with some residual arthritic pain patellofemoral joint and lateral compartment he will also have continued related posterior and lower leg pain related to his lumbar spine  He agrees to proceed with surgery  The procedure has been fully reviewed with the patient; The risks and benefits of surgery have been discussed and explained and understood. Alternative treatment has also been reviewed, questions were encouraged and answered.  The postoperative plan is also been reviewed.  Nonsurgical treatment as described in the history and physical section was attempted and unsuccessful and the patient has agreed to proceed with surgical intervention to improve their situation.  No orders of the defined types were placed in this encounter.   Fuller CanadaStanley Xue Low, MD 05/23/2018 12:45 PM

## 2018-05-23 NOTE — Patient Instructions (Signed)
Meniscus Injury, Arthroscopy   Arthroscopy is a surgical procedure that involves the use of a small scope that has a camera and surgical instruments on the end (arthroscope). An arthroscope can be used to repair your meniscus injury.  LET YOUR HEALTH CARE PROVIDER KNOW ABOUT:  Any allergies you have.  All medicines you are taking, including vitamins, herbs, eyedrops, creams, and over-the-counter medicines.  Any recent colds or infections you have had or currently have.  Previous problems you or members of your family have had with the use of anesthetics.  Any blood disorders or blood clotting problems you have.  Previous surgeries you have had.  Medical conditions you have. RISKS AND COMPLICATIONS Generally, this is a safe procedure. However, as with any procedure, problems can occur. Possible problems include:  Damage to nerves or blood vessels.  Excess bleeding.  Blood clots.  Infection. BEFORE THE PROCEDURE  Do not eat or drink for 6-8 hours before the procedure.  Take medicines as directed by your surgeon. Ask your surgeon about changing or stopping your regular medicines.  You may have lab tests the morning of surgery. PROCEDURE  You will be given one of the following:   A medicine that numbs the area (local anesthesia).  A medicine that makes you go to sleep (general anesthesia).  A medicine injected into your spine that numbs your body below the waist (spinal anesthesia). Most often, several small cuts (incisions) are made in the knee. The arthroscope and instruments go into the incisions to repair the damage. The torn portion of the meniscus is removed.   AFTER THE PROCEDURE  You will be taken to the recovery area where your progress will be monitored. When you are awake, stable, and taking fluids without complications, you will be allowed to go home. This is usually the same day. A torn or stretched ligament (ligament sprain) may take 6-8 weeks to heal.   It  takes about the 4-6 WEEKS if your surgeon removed a torn meniscus.  A repaired meniscus may require 6-12 weeks of recovery time.  A torn ligament needing reconstructive surgery may take 6-12 months to heal fully.   This information is not intended to replace advice given to you by your health care provider. Make sure you discuss any questions you have with your health care provider. You have decided to proceed with operative arthroscopy of the knee. You have decided not to continue with nonoperative measures such as but not limited to oral medication, weight loss, activity modification, physical therapy, bracing, or injection.  We will perform operative arthroscopy of the knee. Some of the risks associated with arthroscopic surgery of the knee include but are not limited to Bleeding Infection Swelling Stiffness Blood clot Pain Need for knee replacement surgery    In compliance with recent [place] law in federal regulation regarding opioid use and abuse and addiction, we will taper (stop) opioid medication after 2 weeks.  If you're not comfortable with these risks and would like to continue with nonoperative treatment please let Dr.  know prior to your surgery. 

## 2018-05-28 ENCOUNTER — Other Ambulatory Visit: Payer: Self-pay | Admitting: Orthopedic Surgery

## 2018-05-28 NOTE — Telephone Encounter (Signed)
Patient is asking for a refill of pain medication to do him until his surgery on 06/05/18.  Hydrocodone-Acetaminophen 5/325 mg  Qty 30 Tablets Limit of five days per [place].  One tablet every four hours as needed for acute pain.  Taking differently: 1 tablet by mouth every 6 (six) hours as needed for severe pain.  PATIENT USES [facility]

## 2018-05-28 NOTE — Patient Instructions (Signed)
Samuel [name]  05/28/2018     @PREFPERIOPPHARMACY @   Your procedure is scheduled on  06/05/2018  [name] at  [facility]   A.M.  Call this number if you have problems the morning of surgery:  [phone]   Remember:  Do not eat or drink after midnight.                       Take these medicines the morning of surgery with A SIP OF WATER  Gabapentin, hydrocodone ( if needed), levothyroxine, oxycodone( if needed).    Do not wear jewelry, make-up or nail polish.  Do not wear lotions, powders, or perfumes, or deodorant.  Do not shave 48 hours prior to surgery.  Men may shave face and neck.  Do not bring valuables to the hospital.  [facility] belongings or valuables.  Contacts, dentures or bridgework may not be worn into surgery.  Leave your suitcase in the car.  After surgery it may be brought to your room.  For patients admitted to the hospital, discharge time will be determined by your treatment team.  Patients discharged the day of surgery will not be allowed to drive home.   Name and phone number of your driver:   family Special instructions:  None  Please read over the following fact sheets that you were given. Anesthesia Post-op Instructions and Care and Recovery After Surgery       Arthroscopic Knee Ligament Repair  Arthroscopic knee ligament repair is a procedure to repair a tear in one or more of the tough, cord-like tissues that connect bones (ligaments) in the knee. This procedure is done by placing a thin tube with a light and camera on the end (arthroscope) through a small incision in the knee. The arthroscope sends images to a monitor in the operating room, and the images are used to help perform the surgery. This procedure is less invasive than open knee surgery. Tell a health care provider about:  Any allergies you have.  All medicines you are taking, including vitamins, herbs, eye drops, creams, and  over-the-counter medicines.  Any problems you or family members have had with anesthetic medicines.  Any blood disorders you have.  Any surgeries you have had.  Any medical conditions you have.  Whether you are pregnant or may be pregnant. What are the risks? Generally, this is a safe procedure. However, problems may occur, including:  Infection.  Bleeding or blood clot.  Allergic reactions to medicines or dyes.  Damage to blood vessels, nerves, or other structures of the knee.  Stiffness.  Failure to relieve symptoms.  Buildup of pressure and swelling in spaces in the leg (compartment syndrome). This is rare. What happens before the procedure? Medicines Ask your health care provider about:  Changing or stopping your regular medicines. This is especially important if you are taking diabetes medicines or blood thinners.  Taking medicines such as aspirin and ibuprofen. These medicines can thin your blood. Do not take these medicines before your procedure if your health care provider instructs you not to. Staying hydrated Follow instructions from your health care provider about hydration, which may include:  Up to 2 hours before the procedure - you may continue to drink clear liquids, such as water, clear fruit juice, black coffee, and plain tea. Eating and drinking restrictions Follow instructions from your health care provider about eating  and drinking, which may include:  8 hours before the procedure - stop eating heavy meals or foods such as meat, fried foods, or fatty foods.  6 hours before the procedure - stop eating light meals or foods, such as toast or cereal.  6 hours before the procedure - stop drinking milk or drinks that contain milk.  2 hours before the procedure - stop drinking clear liquids. General instructions  You may have a physical exam of your knee.  You may have some imaging tests of your knee, such as X-rays or MRI.  Do not use any products  that contain nicotine or tobacco for as long as directed before your procedure. This includes cigarettes and e-cigarettes. If you need help quitting, ask your health care provider.  You may be asked to shower with a germ-killing soap.  Ask your health care provider how your surgical site will be marked or identified.  Plan to have someone take you home from the hospital or clinic.  If you will be going home right after the procedure, plan to have someone with you for 24 hours. What happens during the procedure?  To lower your risk of infection: ? Your health care team will wash or sanitize their hands. ? Your skin will be washed with soap.  Small monitors will be put on your body. They are used to check your heart, blood pressure, and oxygen levels.  An IV tube will be inserted into one of your veins.  You may be given one or more of the following: ? A medicine to help you relax (sedative). ? A medicine to make you fall asleep (general anesthetic).  A breathing tube will be placed down your throat and into your lungs to help you breathe during the procedure.  A cuff may be placed around your upper leg to slow bleeding during the procedure.  Several small incisions will be made in your knee.  Your knee joint will be flushed and filled with a germ-free saltwater solution (sterile saline). This expands the knee joint and clears any blood, which lets your surgeon see your knee more clearly.  An arthroscope will be inserted through one of the incisions to examine your knee.  Surgical instruments will be inserted through the other incisions to repair injured tissue. Injured tissue will be stitched (sutured) together, and in some cases tissue may be removed.  Sterile saline will be removed from your knee.  Your incisions will be closed with absorbable sutures and covered with bandages (dressings).  Your knee may be placed in a brace or immobilizer at the end of the procedure or right  after the procedure. The procedure may vary among health care providers and hospitals. What happens after the procedure?  Your blood pressure, heart rate, breathing rate and blood oxygen level will be monitored until the medicines you were given have worn off.  You may be given medicine for pain.  You may get crutches to help you walk without supporting your body weight on your knee.  You may have to wear compression stockings. These stockings help to prevent blood clots and reduce swelling in your legs.  Do not drive until your health care provider approves.  You will work with a physical therapist to determine the best course of rehab (rehabilitation) for you. Rehab is very important after this procedure. Summary  Arthroscopic knee ligament repair is a procedure to repair a tear in one or more of the tough, cord-like tissues that connect bones (  ligaments) in the knee. This procedure is less invasive than open knee surgery.  Plan to have someone take you home from the hospital or clinic.  Do not use any products that contain nicotine or tobacco for as long as directed before your procedure. This includes cigarettes and e-cigarettes. If you need help quitting, ask your health care provider.  You will work with a physical therapist to determine the best course of rehab (rehabilitation) for you. Rehab is very important after this procedure. This information is not intended to replace advice given to you by your health care provider. Make sure you discuss any questions you have with your health care provider. Document Released: 05/04/2000 Document Revised: 06/14/2016 Document Reviewed: 06/14/2016 Elsevier Interactive Patient Education  2019 [employer]  Arthroscopic Knee Ligament Repair, Care After This sheet gives you information about how to care for yourself after your procedure. Your health care provider may also give you more specific instructions. If you have problems or questions,  contact your health care provider. What can I expect after the procedure? After the procedure, it is common to have:  Pain in your knee.  Bruising and swelling on your knee, calf, and ankle for 3-4 days.  Fatigue. Follow these instructions at home: If you have a brace or immobilizer:  Wear the brace or immobilizer as told by your health care provider. Remove it only as told by your health care provider.  Loosen the splint or immobilizer if your toes tingle, become numb, or turn cold and blue.  Keep the brace or immobilizer clean. Bathing  Do not take baths, swim, or use a hot tub until your health care provider approves. Ask your health care provider if you can take showers.  Keep your bandage (dressing) dry until your health care provider says that it can be removed. Cover it and your brace or immobilizer with a watertight covering when you take a shower. Incision care   Follow instructions from your health care provider about how to take care of your incision. Make sure you: ? Wash your hands with soap and water before you change your bandage (dressing). If soap and water are not available, use hand sanitizer. ? Change your dressing as told by your health care provider. ? Leave stitches (sutures), skin glue, or adhesive strips in place. These skin closures may need to stay in place for 2 weeks or longer. If adhesive strip edges start to loosen and curl up, you may trim the loose edges. Do not remove adhesive strips completely unless your health care provider tells you to do that.  Check your incision area every day for signs of infection. Check for: ? More redness, swelling, or pain. ? More fluid or blood. ? Warmth. ? Pus or a bad smell. Managing pain, stiffness, and swelling   If directed, put ice on the affected area. ? If you have a removable brace or immobilizer, remove it as told by your health care provider. ? Put ice in a plastic bag. ? Place a towel between your  skin and the bag or between your brace or immobilizer and the bag. ? Leave the ice on for 20 minutes, 2-3 times a day.  Move your toes often to avoid stiffness and to lessen swelling.  Raise (elevate) the injured area above the level of your heart while you are sitting or lying down. Driving  Do not drive until your health care provider approves. If you have a brace or immobilizer on your leg,  ask your health care provider when it is safe for you to drive.  Do not drive or use heavy machinery while taking prescription pain medicine. Activity  Rest as directed. Ask your health care provider what activities are safe for you.  Do physical therapy exercises as told by your health care provider. Physical therapy will help you regain strength and motion in your knee.  Follow instructions from your health care provider about: ? When you may start motion exercises. ? When you may start riding a stationary bike and doing other low-impact activities. ? When you may start to jog and do other high-impact activities. Safety  Do not use the injured limb to support your body weight until your health care provider says that you can. Use crutches as told by your health care provider. General instructions  Do not use any products that contain nicotine or tobacco, such as cigarettes and e-cigarettes. These can delay bone healing. If you need help quitting, ask your health care provider.  To prevent or treat constipation while you are taking prescription pain medicine, your health care provider may recommend that you: ? Drink enough fluid to keep your urine clear or pale yellow. ? Take over-the-counter or prescription medicines. ? Eat foods that are high in fiber, such as fresh fruits and vegetables, whole grains, and beans. ? Limit foods that are high in fat and processed sugars, such as fried and sweet foods.  Take over-the-counter and prescription medicines only as told by your health care  provider.  Keep all follow-up visits as told by your health care provider. This is important. Contact a health care provider if:  You have more redness, swelling, or pain around an incision.  You have more fluid or blood coming from an incision.  Your incision feels warm to the touch.  You have a fever.  You have pain or swelling in your knee, and it gets worse.  You have pain that does not get better with medicine. Get help right away if:  You have trouble breathing.  You have pus or a bad smell coming from an incision.  You have numbness and tingling near the knee joint. Summary  After the procedure, it is common to have knee pain with bruising and swelling on your knee, calf, and ankle.  Icing your knee and raising your leg above the level of your heart will help control the pain and the swelling.  Do physical therapy exercises as told by your health care provider. Physical therapy will help you regain strength and motion in your knee. This information is not intended to replace advice given to you by your health care provider. Make sure you discuss any questions you have with your health care provider. Document Released: 02/25/2013 Document Revised: 05/01/2016 Document Reviewed: 05/01/2016 Elsevier Interactive Patient Education  2019 [employer]  General Anesthesia, Adult General anesthesia is the use of medicines to make a person "go to sleep" (unconscious) for a medical procedure. General anesthesia must be used for certain procedures, and is often recommended for procedures that:  Last a long time.  Require you to be still or in an unusual position.  Are major and can cause blood loss. The medicines used for general anesthesia are called general anesthetics. As well as making you unconscious for a certain amount of time, these medicines:  Prevent pain.  Control your blood pressure.  Relax your muscles. Tell a health care provider about:  Any allergies you  have.  All medicines  you are taking, including vitamins, herbs, eye drops, creams, and over-the-counter medicines.  Any problems you or family members have had with anesthetic medicines.  Types of anesthetics you have had in the past.  Any blood disorders you have.  Any surgeries you have had.  Any medical conditions you have.  Any recent upper respiratory, chest, or ear infections.  Any history of: ? Heart or lung conditions, such as heart failure, sleep apnea, asthma, or chronic obstructive pulmonary disease (COPD). ? Financial plannerMilitary service. ? Depression or anxiety.  Any tobacco or drug use, including marijuana or alcohol use.  Whether you are pregnant or may be pregnant. What are the risks? Generally, this is a safe procedure. However, problems may occur, including:  Allergic reaction.  Lung and heart problems.  Inhaling food or liquid from the stomach into the lungs (aspiration).  Nerve injury.  Dental injury.  Air in the bloodstream, which can lead to stroke.  Extreme agitation or confusion (delirium) when you wake up from the anesthetic.  Waking up during your procedure and being unable to move. This is rare. These problems are more likely to develop if you are having a major surgery or if you have an advanced or serious medical condition. You can prevent some of these complications by answering all of your health care provider's questions thoroughly and by following all instructions before your procedure. General anesthesia can cause side effects, including:  Nausea or vomiting.  A sore throat from the breathing tube.  Hoarseness.  Wheezing or coughing.  Shaking chills.  Tiredness.  Body aches.  Anxiety.  Sleepiness or drowsiness.  Confusion or agitation. What happens before the procedure? Staying hydrated Follow instructions from your health care provider about hydration, which may include:  Up to 2 hours before the procedure - you may continue to  drink clear liquids, such as water, clear fruit juice, black coffee, and plain tea.  Eating and drinking restrictions Follow instructions from your health care provider about eating and drinking, which may include:  8 hours before the procedure - stop eating heavy meals or foods such as meat, fried foods, or fatty foods.  6 hours before the procedure - stop eating light meals or foods, such as toast or cereal.  6 hours before the procedure - stop drinking milk or drinks that contain milk.  2 hours before the procedure - stop drinking clear liquids. Medicines Ask your health care provider about:  Changing or stopping your regular medicines. This is especially important if you are taking diabetes medicines or blood thinners.  Taking medicines such as aspirin and ibuprofen. These medicines can thin your blood. Do not take these medicines unless your health care provider tells you to take them.  Taking over-the-counter medicines, vitamins, herbs, and supplements. Do not take these during the week before your procedure unless your health care provider approves them. General instructions  Starting 3-6 weeks before the procedure, do not use any products that contain nicotine or tobacco, such as cigarettes and e-cigarettes. If you need help quitting, ask your health care provider.  If you brush your teeth on the morning of the procedure, make sure to spit out all of the toothpaste.  Tell your health care provider if you become ill or develop a cold, cough, or fever.  If instructed by your health care provider, bring your sleep apnea device with you on the day of your surgery (if applicable).  Ask your health care provider if you will be going home the  same day, the following day, or after a longer hospital stay. ? Plan to have someone take you home from the hospital or clinic. ? Plan to have a responsible adult care for you for at least 24 hours after you leave the hospital or clinic. This is  important. What happens during the procedure?   You will be given anesthetics through both of the following: ? A mask placed over your nose and mouth. ? An IV in one of your veins.  You may receive a medicine to help you relax (sedative).  After you are unconscious, a breathing tube may be inserted down your throat to help you breathe. This will be removed before you wake up.  An anesthesia specialist will stay with you throughout your procedure. He or she will: ? Keep you comfortable and safe by continuing to give you medicines and adjusting the amount of medicine that you get. ? Monitor your blood pressure, pulse, and oxygen levels to make sure that the anesthetics do not cause any problems. The procedure may vary among health care providers and hospitals. What happens after the procedure?  Your blood pressure, temperature, heart rate, breathing rate, and blood oxygen level will be monitored until the medicines you were given have worn off.  You will wake up in a recovery area. You may wake up slowly.  If you feel anxious or agitated, you may be given medicine to help you calm down.  If you will be going home the same day, your health care provider may check to make sure you can walk, drink, and urinate.  Your health care provider will treat any pain or side effects you have before you go home.  Do not drive for 24 hours if you were given a sedative. Summary  General anesthesia is used to keep you still and prevent pain during a procedure.  It is important to tell your health care provider about your medical history and any surgeries you have had, and previous experience with anesthesia.  Follow your health care provider's instructions about when to stop eating, drinking, or taking certain medicines before your procedure.  Plan to have someone take you home from the hospital or clinic. This information is not intended to replace advice given to you by your health care  provider. Make sure you discuss any questions you have with your health care provider. Document Released: 08/14/2007 Document Revised: 09/24/2017 Document Reviewed: 12/21/2016 Elsevier Interactive Patient Education  2019 [employer] General Anesthesia, Adult, Care After This sheet gives you information about how to care for yourself after your procedure. Your health care provider may also give you more specific instructions. If you have problems or questions, contact your health care provider. What can I expect after the procedure? After the procedure, the following side effects are common:  Pain or discomfort at the IV site.  Nausea.  Vomiting.  Sore throat.  Trouble concentrating.  Feeling cold or chills.  Weak or tired.  Sleepiness and fatigue.  Soreness and body aches. These side effects can affect parts of the body that were not involved in surgery. Follow these instructions at home:  For at least 24 hours after the procedure:  Have a responsible adult stay with you. It is important to have someone help care for you until you are awake and alert.  Rest as needed.  Do not: ? Participate in activities in which you could fall or become injured. ? Drive. ? Use heavy machinery. ? Drink alcohol. ?  Take sleeping pills or medicines that cause drowsiness. ? Make important decisions or sign legal documents. ? Take care of children on your own. Eating and drinking  Follow any instructions from your health care provider about eating or drinking restrictions.  When you feel hungry, start by eating small amounts of foods that are soft and easy to digest (bland), such as toast. Gradually return to your regular diet.  Drink enough fluid to keep your urine pale yellow.  If you vomit, rehydrate by drinking water, juice, or clear broth. General instructions  If you have sleep apnea, surgery and certain medicines can increase your risk for breathing problems. Follow  instructions from your health care provider about wearing your sleep device: ? Anytime you are sleeping, including during daytime naps. ? While taking prescription pain medicines, sleeping medicines, or medicines that make you drowsy.  Return to your normal activities as told by your health care provider. Ask your health care provider what activities are safe for you.  Take over-the-counter and prescription medicines only as told by your health care provider.  If you smoke, do not smoke without supervision.  Keep all follow-up visits as told by your health care provider. This is important. Contact a health care provider if:  You have nausea or vomiting that does not get better with medicine.  You cannot eat or drink without vomiting.  You have pain that does not get better with medicine.  You are unable to pass urine.  You develop a skin rash.  You have a fever.  You have redness around your IV site that gets worse. Get help right away if:  You have difficulty breathing.  You have chest pain.  You have blood in your urine or stool, or you vomit blood. Summary  After the procedure, it is common to have a sore throat or nausea. It is also common to feel tired.  Have a responsible adult stay with you for the first 24 hours after general anesthesia. It is important to have someone help care for you until you are awake and alert.  When you feel hungry, start by eating small amounts of foods that are soft and easy to digest (bland), such as toast. Gradually return to your regular diet.  Drink enough fluid to keep your urine pale yellow.  Return to your normal activities as told by your health care provider. Ask your health care provider what activities are safe for you. This information is not intended to replace advice given to you by your health care provider. Make sure you discuss any questions you have with your health care provider. Document Released: 08/13/2000 Document  Revised: 12/21/2016 Document Reviewed: 12/21/2016 Elsevier Interactive Patient Education  2019 [employer].

## 2018-05-28 NOTE — Telephone Encounter (Signed)
RECOMMEND TYLENOL 500 MG EVERY 6 HRS   NO OPIOIDS UNTIL SURGERY

## 2018-05-29 ENCOUNTER — Telehealth: Payer: Self-pay | Admitting: Radiology

## 2018-05-29 ENCOUNTER — Encounter (HOSPITAL_COMMUNITY)
Admission: RE | Admit: 2018-05-29 | Discharge: 2018-05-29 | Disposition: A | Discharge status: Home / Self Care | Payer: Medicare HMO | Source: Ambulatory Visit | Attending: Orthopedic Surgery | Admitting: Orthopedic Surgery

## 2018-05-29 ENCOUNTER — Other Ambulatory Visit: Payer: Self-pay

## 2018-05-29 ENCOUNTER — Encounter (HOSPITAL_COMMUNITY): Payer: Self-pay

## 2018-05-29 DIAGNOSIS — Z01812 Encounter for preprocedural laboratory examination: Secondary | ICD-10-CM | POA: Insufficient documentation

## 2018-05-29 HISTORY — DX: Chronic obstructive pulmonary disease, unspecified: J44.9

## 2018-05-29 HISTORY — DX: Hypothyroidism, unspecified: E03.9

## 2018-05-29 HISTORY — DX: Unspecified osteoarthritis, unspecified site: M19.90

## 2018-05-29 LAB — CBC WITH DIFFERENTIAL/PLATELET
Abs Immature Granulocytes: 0.04 10*3/uL (ref 0.00–0.07)
Basophils Absolute: 0 10*3/uL (ref 0.0–0.1)
Basophils Relative: 0 %
Eosinophils Absolute: 0.5 10*3/uL (ref 0.0–0.5)
Eosinophils Relative: 4 %
HEMATOCRIT: 36.5 % — AB (ref 39.0–52.0)
Hemoglobin: 11.7 g/dL — ABNORMAL LOW (ref 13.0–17.0)
Immature Granulocytes: 0 %
Lymphocytes Relative: 12 %
Lymphs Abs: 1.7 10*3/uL (ref 0.7–4.0)
MCH: 29.6 pg (ref 26.0–34.0)
MCHC: 32.1 g/dL (ref 30.0–36.0)
MCV: 92.4 fL (ref 80.0–100.0)
Monocytes Absolute: 1.3 10*3/uL — ABNORMAL HIGH (ref 0.1–1.0)
Monocytes Relative: 10 %
Neutro Abs: 10.2 10*3/uL — ABNORMAL HIGH (ref 1.7–7.7)
Neutrophils Relative %: 74 %
Platelets: 277 10*3/uL (ref 150–400)
RBC: 3.95 MIL/uL — ABNORMAL LOW (ref 4.22–5.81)
RDW: 15.3 % (ref 11.5–15.5)
WBC: 13.8 10*3/uL — ABNORMAL HIGH (ref 4.0–10.5)
nRBC: 0 % (ref 0.0–0.2)

## 2018-05-29 LAB — BASIC METABOLIC PANEL
Anion gap: 4 — ABNORMAL LOW (ref 5–15)
BUN: 24 mg/dL — ABNORMAL HIGH (ref 8–23)
CALCIUM: 8 mg/dL — AB (ref 8.9–10.3)
CO2: 22 mmol/L (ref 22–32)
Chloride: 111 mmol/L (ref 98–111)
Creatinine, Ser: 0.69 mg/dL (ref 0.61–1.24)
GFR calc non Af Amer: >60 mL/min (ref >60)
Glucose, Bld: 110 mg/dL — ABNORMAL HIGH (ref 70–99)
Potassium: 3.6 mmol/L (ref 3.5–5.1)
Sodium: 137 mmol/L (ref 135–145)

## 2018-05-29 NOTE — Telephone Encounter (Signed)
Patient has brought by his insurance cards, when I called Tedd Sias he no longer has this plan, it termed in 08/2017.

## 2018-05-29 NOTE — Telephone Encounter (Signed)
I called [employer] as long as the doctor and facility are in network no prior Berkley Harvey is required for CPT code 23300 knee arthroscopy  Ref number for the call is [id number]

## 2018-05-29 NOTE — Telephone Encounter (Signed)
I left message with his son for him to call me back so I can let him know this.

## 2018-06-03 NOTE — H&P (Signed)
PREOP CONSULT/REFERRAL INTRA-OFFICE FROM DR Samuel BrooksJW [name]     Chief Complaint  Patient presents with  . Knee Pain      right /surgical consult       68 year old male sent to me by Dr. Hilda [name] for pain in his right knee which he has had at least since December of last year.  He has a chronic history of lumbar fusion requiring oxycodone intermittently from his primary care doctor Dr. Janna [name]   He comes in today complaining of pain in his left knee medial joint line and posteriorly with radiation down into the foot which he says is been worse recently.   The patient reports no history of trauma but progressively increasing pain in the right knee with diffuse knee pain described as dull throbbing intermittent and worse with exercise and activity no improvement after injection oxycodone and hydrocodone         Review of Systems  Respiratory: Negative for cough.   Cardiovascular: Negative for chest pain.  Musculoskeletal: Positive for back pain and joint pain.  Neurological: Positive for tingling and sensory change.  All other systems reviewed and are negative.       History reviewed. No pertinent past medical history.        Past Surgical History:  Procedure Laterality Date  . ELBOW SURGERY   2002  . LAMINECTOMY      . SPINAL FUSION        has cervical fusion and lumbar fusion 6 lumbar surgeries and 3 cervical surgeries           Family History  Problem Relation Age of Onset  . Heart disease Mother    . Lung disease Father      Social History        Tobacco Use  . Smoking status: Never Smoker  . Smokeless tobacco: Never Used  Substance Use Topics  . Alcohol use: Not on file  . Drug use: Not on file      Not on File     Active Medications      Current Meds  Medication Sig  . gabapentin (NEURONTIN) 600 MG tablet Take 600 mg by mouth 3 (three) times daily.  Marland Kitchen. HYDROcodone-acetaminophen (NORCO/VICODIN) 5-325 MG tablet One tablet every four hours as needed for  acute pain.  Limit of five days per Samuel [name] statue.  Marland Kitchen. levothyroxine (SYNTHROID, LEVOTHROID) 300 MCG tablet Take 300 mcg by mouth daily before breakfast.  . naproxen (NAPROSYN) 500 MG tablet Take 500 mg by mouth 2 (two) times daily.  Marland Kitchen. oxycodone (OXY-IR) 5 MG capsule Take 5 mg by mouth every 4 (four) hours as needed.  . traZODone (DESYREL) 100 MG tablet Take 100 mg by mouth at bedtime.        BP (!) 161/88   Pulse 81   Ht 5\' 6"  (1.676 m)   Wt 165 lb (74.8 kg)   BMI 26.63 kg/m    Physical Exam Constitutional:      General: He is not in acute distress.    Appearance: He is well-developed. He is not diaphoretic.  HENT:     Head: Normocephalic and atraumatic.     Right Ear: External ear normal.     Left Ear: External ear normal.     Nose: Nose normal.  Eyes:     General:        Right eye: No discharge.        Left eye: No discharge.     Conjunctiva/sclera:  Conjunctivae normal.     Pupils: Pupils are equal, round, and reactive to light.  Neck:     Musculoskeletal: Normal range of motion and neck supple.     Thyroid: No thyromegaly.     Trachea: No tracheal deviation.  Cardiovascular:     Rate and Rhythm: Normal rate and regular rhythm.  Pulmonary:     Effort: Pulmonary effort is normal. No respiratory distress.     Breath sounds: No stridor. No wheezing.  Chest:     Chest wall: No tenderness.  Abdominal:     General: There is no distension.     Palpations: Abdomen is soft. There is no mass.     Tenderness: There is no guarding.  Musculoskeletal:     Right knee: He exhibits no effusion.  Lymphadenopathy:     Cervical: No cervical adenopathy.  Skin:    General: Skin is warm and dry.     Capillary Refill: Capillary refill takes less than 2 seconds.  Neurological:     Mental Status: He is alert and oriented to person, place, and time.     Gait: Gait normal.     Deep Tendon Reflexes: Reflexes are normal and symmetric.  Psychiatric:        Behavior: Behavior normal.          Thought Content: Thought content normal.        Judgment: Judgment normal.        Right Knee Exam    Muscle Strength  The patient has normal right knee strength.   Tenderness  The patient is experiencing tenderness in the medial joint line.   Range of Motion  Extension: normal  Flexion:  130 normal    Tests  McMurray:  Medial - positive Lateral - negative Varus: negative Valgus: negative Drawer:  Anterior - negative    Posterior - negative   Other  Erythema: absent Scars: absent Sensation: normal Pulse: present Swelling: none Effusion: no effusion present     Left Knee Exam    Muscle Strength  The patient has normal left knee strength.   Tenderness  The patient is experiencing no tenderness.    Range of Motion  Extension: normal  Flexion: normal    Tests  McMurray:  Medial - negative Lateral - negative Varus: negative Valgus: negative Drawer:  Anterior - negative     Posterior - negative   Other  Erythema: absent Scars: absent Sensation: normal Pulse: present Swelling: none           MEDICAL DECISION SECTION  Plain films show mild to moderate arthritis of the knee and MRI shows tear.  After reviewing the MRI and the report I agree that the he does have a radial tear of the posterior Glatt of the medial meniscus with complex tear there is a small undersurface tear lateral meniscus and there are cartilage abnormalities including partial thickness loss of cartilage medial and patellofemoral area.   This include high-grade partial-thickness loss full-thickness loss medial side and lateral side showing partial thickness loss and full-thickness loss in the lateral compartment         Encounter Diagnoses  Name Primary?  . Primary osteoarthritis of right knee Yes  . Degenerative tear of medial meniscus of right knee    . Meniscus, lateral, derangement, right    . Degenerative disc disease, lumbar    . S/P lumbar spinal fusion          PLAN:     Surgical procedure  planned: Arthroscopy right knee partial medial meniscectomy partial lateral meniscectomy   I made a point to tell the patient that he will have some pain relief primarily medially with some residual arthritic pain patellofemoral joint and lateral compartment he will also have continued related posterior and lower leg pain related to his lumbar spine   He agrees to proceed with surgery   The procedure has been fully reviewed with the patient; The risks and benefits of surgery have been discussed and explained and understood. Alternative treatment has also been reviewed, questions were encouraged and answered. The postoperative plan is also been reviewed.   Nonsurgical treatment as described in the history and physical section was attempted and unsuccessful and the patient has agreed to proceed with surgical intervention to improve their situation.   No orders of the defined types were placed in this encounter.

## 2018-06-05 ENCOUNTER — Encounter (HOSPITAL_COMMUNITY): Payer: Self-pay | Admitting: *Deleted

## 2018-06-05 ENCOUNTER — Ambulatory Visit (HOSPITAL_COMMUNITY): Payer: Medicare HMO | Admitting: Anesthesiology

## 2018-06-05 ENCOUNTER — Ambulatory Visit (HOSPITAL_COMMUNITY)
Admission: RE | Admit: 2018-06-05 | Discharge: 2018-06-05 | Disposition: A | Discharge status: Home / Self Care | Payer: Medicare HMO | Attending: Orthopedic Surgery | Admitting: Orthopedic Surgery

## 2018-06-05 ENCOUNTER — Encounter (HOSPITAL_COMMUNITY)
Admission: RE | Disposition: A | Discharge status: Home / Self Care | Payer: Self-pay | Source: Home / Self Care | Attending: Orthopedic Surgery

## 2018-06-05 DIAGNOSIS — M1712 Unilateral primary osteoarthritis, left knee: Secondary | ICD-10-CM

## 2018-06-05 DIAGNOSIS — M23241 Derangement of anterior horn of lateral meniscus due to old tear or injury, right knee: Secondary | ICD-10-CM | POA: Insufficient documentation

## 2018-06-05 DIAGNOSIS — M23221 Derangement of posterior horn of medial meniscus due to old tear or injury, right knee: Secondary | ICD-10-CM | POA: Insufficient documentation

## 2018-06-05 DIAGNOSIS — Z79891 Long term (current) use of opiate analgesic: Secondary | ICD-10-CM | POA: Diagnosis not present

## 2018-06-05 DIAGNOSIS — Z79890 Hormone replacement therapy (postmenopausal): Secondary | ICD-10-CM | POA: Insufficient documentation

## 2018-06-05 DIAGNOSIS — M23321 Other meniscus derangements, posterior horn of medial meniscus, right knee: Secondary | ICD-10-CM

## 2018-06-05 DIAGNOSIS — Z79899 Other long term (current) drug therapy: Secondary | ICD-10-CM | POA: Diagnosis not present

## 2018-06-05 DIAGNOSIS — M1711 Unilateral primary osteoarthritis, right knee: Secondary | ICD-10-CM | POA: Insufficient documentation

## 2018-06-05 DIAGNOSIS — M23300 Other meniscus derangements, unspecified lateral meniscus, right knee: Secondary | ICD-10-CM | POA: Diagnosis not present

## 2018-06-05 DIAGNOSIS — M23251 Derangement of posterior horn of lateral meniscus due to old tear or injury, right knee: Secondary | ICD-10-CM | POA: Insufficient documentation

## 2018-06-05 DIAGNOSIS — J449 Chronic obstructive pulmonary disease, unspecified: Secondary | ICD-10-CM | POA: Diagnosis not present

## 2018-06-05 DIAGNOSIS — M2241 Chondromalacia patellae, right knee: Secondary | ICD-10-CM | POA: Insufficient documentation

## 2018-06-05 DIAGNOSIS — Z791 Long term (current) use of non-steroidal anti-inflammatories (NSAID): Secondary | ICD-10-CM | POA: Insufficient documentation

## 2018-06-05 DIAGNOSIS — M23204 Derangement of unspecified medial meniscus due to old tear or injury, left knee: Secondary | ICD-10-CM

## 2018-06-05 DIAGNOSIS — E039 Hypothyroidism, unspecified: Secondary | ICD-10-CM | POA: Diagnosis not present

## 2018-06-05 DIAGNOSIS — X58XXXA Exposure to other specified factors, initial encounter: Secondary | ICD-10-CM | POA: Diagnosis not present

## 2018-06-05 HISTORY — PX: KNEE ARTHROSCOPY WITH LATERAL MENISECTOMY: SHX6193

## 2018-06-05 SURGERY — ARTHROSCOPY, KNEE, WITH LATERAL MENISCECTOMY
Anesthesia: General | Site: Knee | Laterality: Right

## 2018-06-05 MED ORDER — IBUPROFEN 400 MG PO TABS
400.0000 mg | ORAL_TABLET | Freq: Once | ORAL | Status: AC
  Administered 2018-06-05: 400 mg via ORAL
  Filled 2018-06-05: qty 1

## 2018-06-05 MED ORDER — SODIUM CHLORIDE 0.9 % IR SOLN
Status: DC | PRN
  Administered 2018-06-05 (×3): 3000 mL

## 2018-06-05 MED ORDER — HYDROCODONE-ACETAMINOPHEN 7.5-325 MG PO TABS: 1.0000 | ORAL_TABLET | Freq: Once | ORAL | Status: DC | PRN

## 2018-06-05 MED ORDER — ONDANSETRON HCL 4 MG/2ML IJ SOLN
INTRAMUSCULAR | Status: AC
  Filled 2018-06-05: qty 2

## 2018-06-05 MED ORDER — HYDROMORPHONE HCL 1 MG/ML IJ SOLN
0.2500 mg | INTRAMUSCULAR | Status: DC | PRN
  Administered 2018-06-05 (×2): 0.25 mg via INTRAVENOUS
  Administered 2018-06-05 (×2): 0.5 mg via INTRAVENOUS
  Filled 2018-06-05 (×3): qty 0.5

## 2018-06-05 MED ORDER — CEFAZOLIN SODIUM-DEXTROSE 2-4 GM/100ML-% IV SOLN
2.0000 g | INTRAVENOUS | Status: AC
  Administered 2018-06-05: 2 g via INTRAVENOUS
  Filled 2018-06-05: qty 100

## 2018-06-05 MED ORDER — LACTATED RINGERS IV SOLN
INTRAVENOUS | Status: DC
  Administered 2018-06-05: 1000 mL via INTRAVENOUS

## 2018-06-05 MED ORDER — PROPOFOL 10 MG/ML IV BOLUS
INTRAVENOUS | Status: AC
  Filled 2018-06-05: qty 40

## 2018-06-05 MED ORDER — BUPIVACAINE-EPINEPHRINE (PF) 0.25% -1:200000 IJ SOLN
INTRAMUSCULAR | Status: DC | PRN
  Administered 2018-06-05: 60 mL

## 2018-06-05 MED ORDER — OXYCODONE HCL 10 MG PO TABS: ORAL_TABLET | ORAL | 0 refills | Status: DC

## 2018-06-05 MED ORDER — ONDANSETRON HCL 4 MG/2ML IJ SOLN
INTRAMUSCULAR | Status: DC | PRN
  Administered 2018-06-05: 4 mg via INTRAVENOUS

## 2018-06-05 MED ORDER — FENTANYL CITRATE (PF) 250 MCG/5ML IJ SOLN
INTRAMUSCULAR | Status: AC
  Filled 2018-06-05: qty 5

## 2018-06-05 MED ORDER — SODIUM CHLORIDE 0.9% FLUSH
INTRAVENOUS | Status: AC
  Filled 2018-06-05: qty 10

## 2018-06-05 MED ORDER — PROPOFOL 10 MG/ML IV BOLUS
INTRAVENOUS | Status: DC | PRN
  Administered 2018-06-05: 180 mg via INTRAVENOUS
  Administered 2018-06-05: 20 mg via INTRAVENOUS
  Administered 2018-06-05: 30 mg via INTRAVENOUS

## 2018-06-05 MED ORDER — DEXAMETHASONE SODIUM PHOSPHATE 10 MG/ML IJ SOLN
INTRAMUSCULAR | Status: AC
  Filled 2018-06-05: qty 1

## 2018-06-05 MED ORDER — LIDOCAINE 2% (20 MG/ML) 5 ML SYRINGE
INTRAMUSCULAR | Status: DC | PRN
  Administered 2018-06-05: 40 mg via INTRAVENOUS

## 2018-06-05 MED ORDER — EPINEPHRINE PF 1 MG/ML IJ SOLN
INTRAMUSCULAR | Status: AC
  Filled 2018-06-05: qty 4

## 2018-06-05 MED ORDER — ONDANSETRON HCL 4 MG/2ML IJ SOLN: 4.0000 mg | Freq: Four times a day (QID) | INTRAMUSCULAR | Status: DC | PRN

## 2018-06-05 MED ORDER — DEXAMETHASONE SODIUM PHOSPHATE 4 MG/ML IJ SOLN
INTRAMUSCULAR | Status: DC | PRN
  Administered 2018-06-05: 10 mg via INTRAVENOUS

## 2018-06-05 MED ORDER — BUPIVACAINE-EPINEPHRINE (PF) 0.25% -1:200000 IJ SOLN
INTRAMUSCULAR | Status: AC
  Filled 2018-06-05: qty 60

## 2018-06-05 MED ORDER — FENTANYL CITRATE (PF) 100 MCG/2ML IJ SOLN
INTRAMUSCULAR | Status: DC | PRN
  Administered 2018-06-05 (×3): 50 ug via INTRAVENOUS
  Administered 2018-06-05 (×4): 25 ug via INTRAVENOUS

## 2018-06-05 MED ORDER — MIDAZOLAM HCL 2 MG/2ML IJ SOLN: 0.5000 mg | Freq: Once | INTRAMUSCULAR | Status: DC | PRN

## 2018-06-05 MED ORDER — BUPIVACAINE-EPINEPHRINE (PF) 0.5% -1:200000 IJ SOLN: INTRAMUSCULAR | Status: DC | PRN

## 2018-06-05 MED ORDER — GLYCOPYRROLATE PF 0.2 MG/ML IJ SOSY
PREFILLED_SYRINGE | INTRAMUSCULAR | Status: DC | PRN
  Administered 2018-06-05: .2 mg via INTRAVENOUS

## 2018-06-05 MED ORDER — PROMETHAZINE HCL 25 MG/ML IJ SOLN: 6.2500 mg | INTRAMUSCULAR | Status: DC | PRN

## 2018-06-05 MED ORDER — CHLORHEXIDINE GLUCONATE 4 % EX LIQD: 60.0000 mL | Freq: Once | CUTANEOUS | Status: DC

## 2018-06-05 MED ORDER — OXYCODONE HCL 5 MG PO TABS
5.0000 mg | ORAL_TABLET | Freq: Once | ORAL | Status: AC
  Administered 2018-06-05: 5 mg via ORAL
  Filled 2018-06-05: qty 1

## 2018-06-05 SURGICAL SUPPLY — 45 items
BANDAGE ELASTIC 6 LF NS (GAUZE/BANDAGES/DRESSINGS) ×2 IMPLANT
BLADE 11 SAFETY STRL DISP (BLADE) ×2 IMPLANT
BLADE AGGRESSIVE PLUS 4.0 (BLADE) ×2 IMPLANT
BNDG CMPR MED 5X6 ELC HKLP NS (GAUZE/BANDAGES/DRESSINGS) ×1
BNDG GAUZE ELAST 4 BULKY (GAUZE/BANDAGES/DRESSINGS) ×1 IMPLANT
CHLORAPREP W/TINT 26ML (MISCELLANEOUS) ×4 IMPLANT
CLOTH BEACON ORANGE TIMEOUT ST (SAFETY) ×2 IMPLANT
COOLER CRYO IC GRAV AND TUBE (ORTHOPEDIC SUPPLIES) ×2 IMPLANT
COVER WAND RF STERILE (DRAPES) ×1 IMPLANT
CUFF CRYO KNEE18X23 MED (MISCELLANEOUS) ×1 IMPLANT
CUFF TOURNIQUET SINGLE 34IN LL (TOURNIQUET CUFF) ×1 IMPLANT
DECANTER SPIKE VIAL GLASS SM (MISCELLANEOUS) ×4 IMPLANT
GAUZE 4X4 16PLY RFD (DISPOSABLE) ×2 IMPLANT
GAUZE SPONGE 4X4 12PLY STRL (GAUZE/BANDAGES/DRESSINGS) ×2 IMPLANT
GAUZE XEROFORM 5X9 LF (GAUZE/BANDAGES/DRESSINGS) ×2 IMPLANT
GLOVE BIOGEL PI IND STRL 7.0 (GLOVE) ×1 IMPLANT
GLOVE BIOGEL PI INDICATOR 7.0 (GLOVE) ×1
GLOVE SKINSENSE NS SZ8.0 LF (GLOVE) ×1
GLOVE SKINSENSE STRL SZ8.0 LF (GLOVE) ×1 IMPLANT
GLOVE SS N UNI LF 8.5 STRL (GLOVE) ×2 IMPLANT
GOWN STRL REUS W/TWL LRG LVL3 (GOWN DISPOSABLE) ×2 IMPLANT
GOWN STRL REUS W/TWL XL LVL3 (GOWN DISPOSABLE) ×2 IMPLANT
IV NS IRRIG 3000ML ARTHROMATIC (IV SOLUTION) ×5 IMPLANT
KIT BLADEGUARD II DBL (SET/KITS/TRAYS/PACK) ×2 IMPLANT
KIT TURNOVER CYSTO (KITS) ×2 IMPLANT
MANIFOLD NEPTUNE II (INSTRUMENTS) ×2 IMPLANT
MARKER SKIN DUAL TIP RULER LAB (MISCELLANEOUS) ×2 IMPLANT
NDL HYPO 18GX1.5 BLUNT FILL (NEEDLE) ×1 IMPLANT
NDL HYPO 21X1.5 SAFETY (NEEDLE) ×1 IMPLANT
NDL SPNL 18GX3.5 QUINCKE PK (NEEDLE) ×1 IMPLANT
NEEDLE HYPO 18GX1.5 BLUNT FILL (NEEDLE) ×2 IMPLANT
NEEDLE HYPO 21X1.5 SAFETY (NEEDLE) ×2 IMPLANT
NEEDLE SPNL 18GX3.5 QUINCKE PK (NEEDLE) ×2 IMPLANT
NS IRRIG 1000ML POUR BTL (IV SOLUTION) ×2 IMPLANT
PACK ARTHRO LIMB DRAPE STRL (MISCELLANEOUS) ×2 IMPLANT
PAD ABD 5X9 TENDERSORB (GAUZE/BANDAGES/DRESSINGS) ×2 IMPLANT
PAD ARMBOARD 7.5X6 YLW CONV (MISCELLANEOUS) ×2 IMPLANT
PADDING CAST COTTON 6X4 STRL (CAST SUPPLIES) ×2 IMPLANT
PROBE BIPOLAR 50 DEGREE SUCT (MISCELLANEOUS) ×1 IMPLANT
SET ARTHROSCOPY INST (INSTRUMENTS) ×2 IMPLANT
SET BASIN LINEN APH (SET/KITS/TRAYS/PACK) ×2 IMPLANT
SUT ETHILON 3 0 FSL (SUTURE) ×3 IMPLANT
SYR 30ML LL (SYRINGE) ×2 IMPLANT
TUBE CONNECTING 12X1/4 (SUCTIONS) ×4 IMPLANT
TUBING ARTHRO INFLOW-ONLY STRL (TUBING) ×2 IMPLANT

## 2018-06-05 NOTE — Progress Notes (Signed)
Education done with patient and son. Care with cryo cuff and crutch training done.

## 2018-06-05 NOTE — Transfer of Care (Signed)
Immediate Anesthesia Transfer of Care Note  Patient: Samuel [name]  Procedure(s) Performed: KNEE ARTHROSCOPY WITH LATERAL MENISCECTOMY and MEDIAL MENSISCECTOMY (Right Knee)  Patient Location: PACU  Anesthesia Type:General  Level of Consciousness: awake, alert , oriented and patient cooperative  Airway & Oxygen Therapy: Patient Spontanous Breathing  Post-op Assessment: Report given to RN and Post -op Vital signs reviewed and stable  Post vital signs: Reviewed and stable  Last Vitals:  Vitals Value Taken Time  BP    Temp    Pulse 80 06/05/2018  8:23 AM  Resp 13 06/05/2018  8:23 AM  SpO2 96 % 06/05/2018  8:23 AM  Vitals shown include unvalidated device data.  Last Pain:  Vitals:   06/05/18 0652  TempSrc: Oral  PainSc: 8       Patients Stated Pain Goal: 6 (06/05/18 9794)  Complications: No apparent anesthesia complications

## 2018-06-05 NOTE — Discharge Instructions (Signed)
The medial meniscus was torn we remove the torn piece  The lateral meniscus was torn we remove that torn piece  She had extensive arthritis in the knee  Weight-bear as tolerated use the crutches or walker as you see fit you can stop using them when you are comfortable  Do the exercises as instructed  Use the Cryo/Cuff ice cuff as instructed   General Anesthesia, Adult, Care After This sheet gives you information about how to care for yourself after your procedure. Your health care provider may also give you more specific instructions. If you have problems or questions, contact your health care provider. What can I expect after the procedure? After the procedure, the following side effects are common:  Pain or discomfort at the IV site.  Nausea.  Vomiting.  Sore throat.  Trouble concentrating.  Feeling cold or chills.  Weak or tired.  Sleepiness and fatigue.  Soreness and body aches. These side effects can affect parts of the body that were not involved in surgery. Follow these instructions at home:  For at least 24 hours after the procedure:  Have a responsible adult stay with you. It is important to have someone help care for you until you are awake and alert.  Rest as needed.  Do not: ? Participate in activities in which you could fall or become injured. ? Drive. ? Use heavy machinery. ? Drink alcohol. ? Take sleeping pills or medicines that cause drowsiness. ? Make important decisions or sign legal documents. ? Take care of children on your own. Eating and drinking  Follow any instructions from your health care provider about eating or drinking restrictions.  When you feel hungry, start by eating small amounts of foods that are soft and easy to digest (bland), such as toast. Gradually return to your regular diet.  Drink enough fluid to keep your urine pale yellow.  If you vomit, rehydrate by drinking water, juice, or clear broth. General  instructions  If you have sleep apnea, surgery and certain medicines can increase your risk for breathing problems. Follow instructions from your health care provider about wearing your sleep device: ? Anytime you are sleeping, including during daytime naps. ? While taking prescription pain medicines, sleeping medicines, or medicines that make you drowsy.  Return to your normal activities as told by your health care provider. Ask your health care provider what activities are safe for you.  Take over-the-counter and prescription medicines only as told by your health care provider.  If you smoke, do not smoke without supervision.  Keep all follow-up visits as told by your health care provider. This is important. Contact a health care provider if:  You have nausea or vomiting that does not get better with medicine.  You cannot eat or drink without vomiting.  You have pain that does not get better with medicine.  You are unable to pass urine.  You develop a skin rash.  You have a fever.  You have redness around your IV site that gets worse. Get help right away if:  You have difficulty breathing.  You have chest pain.  You have blood in your urine or stool, or you vomit blood. Summary  After the procedure, it is common to have a sore throat or nausea. It is also common to feel tired.  Have a responsible adult stay with you for the first 24 hours after general anesthesia. It is important to have someone help care for you until you are awake and alert.  When you feel hungry, start by eating small amounts of foods that are soft and easy to digest (bland), such as toast. Gradually return to your regular diet.  Drink enough fluid to keep your urine pale yellow.  Return to your normal activities as told by your health care provider. Ask your health care provider what activities are safe for you. This information is not intended to replace advice given to you by your health care  provider. Make sure you discuss any questions you have with your health care provider. Document Released: 08/13/2000 Document Revised: 12/21/2016 Document Reviewed: 12/21/2016 Elsevier Interactive Patient Education  2019 [employer]

## 2018-06-05 NOTE — Interval H&P Note (Signed)
History and Physical Interval Note:  06/05/2018 7:18 AM  Samuel [name]  has presented today for surgery, with the diagnosis of torn medial and lateral meniscus  The various methods of treatment have been discussed with the patient and family. After consideration of risks, benefits and other options for treatment, the patient has consented to  Procedure(s): KNEE ARTHROSCOPY WITH LATERAL MENISCECTOMY and MEDIAL MENSISCECTOMY (Right) as a surgical intervention .  The patient's history has been reviewed, patient examined, no change in status, stable for surgery.  I have reviewed the patient's chart and labs.  Questions were answered to the patient's satisfaction.     [name]

## 2018-06-05 NOTE — Anesthesia Preprocedure Evaluation (Signed)
Anesthesia Evaluation  Patient identified by MRN, date of birth, ID band Patient awake    Reviewed: Allergy & Precautions, NPO status , Patient's Chart, lab work & pertinent test results  Airway Mallampati: III  TM Distance: >3 FB Neck ROM: Full    Dental no notable dental hx. (+) Edentulous Upper, Edentulous Lower   Pulmonary COPD,  Rarely uses inhalers  Never smoked  + work exposures   Slight wheeze appreciated on the R Pulmonary exam normal breath sounds clear to auscultation       Cardiovascular Exercise Tolerance: Good negative cardio ROS Normal cardiovascular examI Rhythm:Regular Rate:Normal  H/o spinal fusions - limited ET related to this    Neuro/Psych negative neurological ROS  negative psych ROS   GI/Hepatic negative GI ROS, Neg liver ROS,   Endo/Other  Hypothyroidism   Renal/GU negative Renal ROS  negative genitourinary   Musculoskeletal  (+) Arthritis , Osteoarthritis,    Abdominal   Peds negative pediatric ROS (+)  Hematology negative hematology ROS (+)   Anesthesia Other Findings   Reproductive/Obstetrics negative OB ROS                             Anesthesia Physical Anesthesia Plan  ASA: III  Anesthesia Plan: General   Post-op Pain Management:    Induction: Intravenous  PONV Risk Score and Plan:   Airway Management Planned: LMA and Oral ETT  Additional Equipment:   Intra-op Plan:   Post-operative Plan: Extubation in OR  Informed Consent: I have reviewed the patients History and Physical, chart, labs and discussed the procedure including the risks, benefits and alternatives for the proposed anesthesia with the patient or authorized representative who has indicated his/her understanding and acceptance.     Dental advisory given  Plan Discussed with: CRNA  Anesthesia Plan Comments: (LMA vs ETT as needed )        Anesthesia Quick Evaluation

## 2018-06-05 NOTE — Op Note (Signed)
06/05/2018  8:25 AM  PATIENT:  Samuel [name]  68 y.o. 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  The patient was in the supine position.  Arthroscopic leg holder on the right leg.  Left leg placed in a well leg holder padded.  After sterile prep and drape timeout was completed site was confirmed procedure confirmed patient confirmed.  Lateral portal was established scope was placed into the knee diagnostic arthroscopy was performed by viewing all compartments of the knee and circumferential manner.  A medial portal was established with the assistance of a spinal needle and the diagnostic arthroscopy was repeated with the probe.  The findings are listed above  I dressed the medial meniscus first time put a straight biting forceps into the joint I resected the ministry of tissue and then removed it with a motorized shaver balancing the meniscus with a shaver and a 50 degree [employer]atec ArthroCare wand.  I then addressed the medial meniscus with a straight shaver debriding the posterior Balch and the anterior Rembold.  We then irrigated the joint remove the loose pieces of cartilage tissue that  remained.  I debrided the lateral femoral condylar lesion which was grade 3 performing a chondroplasty but this was not the major portion of the procedure  After thorough irrigation on the wash mode of the arthroscope we injected the joint with 30 cc of Marcaine and then another 20 cc was injected into the soft tissues on the medial side of the knee  2 sutures were placed 1 in each portal with a 3-0 nylon suture  The knee was wrapped with sterile dressings Ace bandage and Cryo/Cuff which was activated  The patient was extubated taken to recovery room in stable condition  SURGEON:  Surgeon(s) and Role:    * Vickki Hearing, MD - Primary  PHYSICIAN ASSISTANT:   ASSISTANTS: none   ANESTHESIA:   none  EBL:  none   BLOOD ADMINISTERED:none  DRAINS: none   LOCAL MEDICATIONS USED:  MARCAINE     SPECIMEN:  No Specimen  DISPOSITION OF SPECIMEN:  N/A  COUNTS:  YES  TOURNIQUET:  * Missing tourniquet times found for documented tourniquets in log: [id number] *  DICTATION: .Dragon Dictation  PLAN OF CARE: Discharge to home after PACU  PATIENT DISPOSITION:  PACU - hemodynamically stable.   Delay start of Pharmacological VTE agent (>24hrs) due to surgical blood loss or risk of bleeding: not applicable

## 2018-06-05 NOTE — Brief Op Note (Signed)
06/05/2018  8:25 AM  PATIENT:  Samuel [name]  68 y.o. 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  The patient was in the supine position.  Arthroscopic leg holder on the right leg.  Left leg placed in a well leg holder padded.  After sterile prep and drape timeout was completed site was confirmed procedure confirmed patient confirmed.  Lateral portal was established scope was placed into the knee diagnostic arthroscopy was performed by viewing all compartments of the knee and circumferential manner.  A medial portal was established with the assistance of a spinal needle and the diagnostic arthroscopy was repeated with the probe.  The findings are listed above  I dressed the medial meniscus first time put a straight biting forceps into the joint I resected the ministry of tissue and then removed it with a motorized shaver balancing the meniscus with a shaver and a 50 degree Linvatec ArthroCare wand.  I then addressed the medial meniscus with a straight shaver debriding the posterior Schwimmer and the anterior Koehn.  We then irrigated the joint remove the loose pieces of cartilage tissue that remained.  I debrided the lateral femoral condylar  lesion which was grade 3 performing a chondroplasty but this was not the major portion of the procedure  After thorough irrigation on the wash mode of the arthroscope we injected the joint with 30 cc of Marcaine and then another 20 cc was injected into the soft tissues on the medial side of the knee  2 sutures were placed 1 in each portal with a 3-0 nylon suture  The knee was wrapped with sterile dressings Ace bandage and Cryo/Cuff which was activated  The patient was extubated taken to recovery room in stable condition  SURGEON:  Surgeon(s) and Role:    * Vickki Hearing, MD - Primary  PHYSICIAN ASSISTANT:   ASSISTANTS: none   ANESTHESIA:   none  EBL:  none   BLOOD ADMINISTERED:none  DRAINS: none   LOCAL MEDICATIONS USED:  MARCAINE     SPECIMEN:  No Specimen  DISPOSITION OF SPECIMEN:  N/A  COUNTS:  YES  TOURNIQUET:  * Missing tourniquet times found for documented tourniquets in log: [id number] *  DICTATION: .Dragon Dictation  PLAN OF CARE: Discharge to home after PACU  PATIENT DISPOSITION:  PACU - hemodynamically stable.   Delay start of Pharmacological VTE agent (>24hrs) due to surgical blood loss or risk of bleeding: not applicable

## 2018-06-05 NOTE — Anesthesia Postprocedure Evaluation (Signed)
Anesthesia Post Note  Patient: Samuel [name]  Procedure(s) Performed: KNEE ARTHROSCOPY WITH LATERAL MENISCECTOMY and MEDIAL MENSISCECTOMY (Right Knee)  Patient location during evaluation: PACU Anesthesia Type: General Level of consciousness: awake and alert and oriented Pain management: pain level controlled Vital Signs Assessment: post-procedure vital signs reviewed and stable Respiratory status: spontaneous breathing Cardiovascular status: stable Postop Assessment: no apparent nausea or vomiting Anesthetic complications: no     Last Vitals:  Vitals:   06/05/18 0652 06/05/18 0823  BP: (!) 147/82 124/78  Pulse: 74 80  Resp: 18 13  Temp: 36.8 C 36.7 C  SpO2: 98% 96%    Last Pain:  Vitals:   06/05/18 0823  TempSrc:   PainSc: 0-No pain                 Joshuan Bolander A

## 2018-06-05 NOTE — Anesthesia Procedure Notes (Signed)
Procedure Name: LMA Insertion Date/Time: 06/05/2018 7:36 AM Performed by: Shiniqua Groseclose A, CRNA Pre-anesthesia Checklist: Patient identified, Emergency Drugs available, Suction available, Patient being monitored and Timeout performed Patient Re-evaluated:Patient Re-evaluated prior to induction Oxygen Delivery Method: Circle system utilized Preoxygenation: Pre-oxygenation with 100% oxygen Induction Type: IV induction LMA: LMA inserted LMA Size: 3.0 Placement Confirmation: positive ETCO2 and breath sounds checked- equal and bilateral Tube secured with: Tape Dental Injury: Teeth and Oropharynx as per pre-operative assessment

## 2018-06-06 ENCOUNTER — Encounter (HOSPITAL_COMMUNITY): Payer: Self-pay | Admitting: Orthopedic Surgery

## 2018-06-13 ENCOUNTER — Ambulatory Visit (INDEPENDENT_AMBULATORY_CARE_PROVIDER_SITE_OTHER): Payer: Medicare HMO | Admitting: Orthopedic Surgery

## 2018-06-13 VITALS — BP 141/82 | HR 80 | Ht 66.0 in | Wt 165.0 lb

## 2018-06-13 DIAGNOSIS — Z9889 Other specified postprocedural states: Secondary | ICD-10-CM

## 2018-06-13 NOTE — Progress Notes (Signed)
Chief Complaint  Patient presents with  . Follow-up    Recheck on right knee, DOS 06-05-18.    Postop day #8  Patient says he hurt his left knee says he got up and his left knee gave way  He is also ask them for more oxycodone which we discussed preoperatively that after a week he would have to get it from his primary care doctor I reiterated to him again today  Portals look good sutures are removed he can start knee exercises at home follow-up with me in 4 weeks  When he comes back determine if his left knee is still bothering him and if so we can x-ray MRI and determine if any surgery needed  06/05/2018  8:25 AM  PATIENT:  Samuel [name]  68 y.o. 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

## 2018-06-13 NOTE — Patient Instructions (Signed)
Continue ice 3-4 times a day  Start knee exercises as instructed  Wear brace on the left knee continue to use the crutches if needed  Please call your primary care doctor for oxycodone

## 2018-06-14 ENCOUNTER — Encounter (HOSPITAL_COMMUNITY): Payer: Self-pay | Admitting: Emergency Medicine

## 2018-06-14 ENCOUNTER — Emergency Department (HOSPITAL_COMMUNITY): Payer: Medicare HMO

## 2018-06-14 ENCOUNTER — Emergency Department (HOSPITAL_COMMUNITY)
Admission: EM | Admit: 2018-06-14 | Discharge: 2018-06-14 | Disposition: A | Discharge status: Home / Self Care | Payer: Medicare HMO | Attending: Emergency Medicine | Admitting: Emergency Medicine

## 2018-06-14 DIAGNOSIS — M25562 Pain in left knee: Secondary | ICD-10-CM | POA: Diagnosis present

## 2018-06-14 DIAGNOSIS — Z79899 Other long term (current) drug therapy: Secondary | ICD-10-CM | POA: Insufficient documentation

## 2018-06-14 DIAGNOSIS — M25462 Effusion, left knee: Secondary | ICD-10-CM | POA: Insufficient documentation

## 2018-06-14 DIAGNOSIS — J449 Chronic obstructive pulmonary disease, unspecified: Secondary | ICD-10-CM | POA: Diagnosis not present

## 2018-06-14 DIAGNOSIS — E039 Hypothyroidism, unspecified: Secondary | ICD-10-CM | POA: Insufficient documentation

## 2018-06-14 MED ORDER — HYDROCODONE-ACETAMINOPHEN 5-325 MG PO TABS
2.0000 | ORAL_TABLET | Freq: Once | ORAL | Status: AC
  Administered 2018-06-14: 2 via ORAL
  Filled 2018-06-14: qty 2

## 2018-06-14 MED ORDER — KETOROLAC TROMETHAMINE 10 MG PO TABS
10.0000 mg | ORAL_TABLET | Freq: Once | ORAL | Status: AC
  Administered 2018-06-14: 10 mg via ORAL
  Filled 2018-06-14: qty 1

## 2018-06-14 MED ORDER — DEXAMETHASONE SODIUM PHOSPHATE 10 MG/ML IJ SOLN
10.0000 mg | Freq: Once | INTRAMUSCULAR | Status: AC
  Administered 2018-06-14: 10 mg via INTRAMUSCULAR
  Filled 2018-06-14: qty 1

## 2018-06-14 MED ORDER — TRAMADOL HCL 50 MG PO TABS: ORAL_TABLET | ORAL | 0 refills | Status: DC

## 2018-06-14 MED ORDER — CYCLOBENZAPRINE HCL 10 MG PO TABS
10.0000 mg | ORAL_TABLET | Freq: Once | ORAL | Status: AC
  Administered 2018-06-14: 10 mg via ORAL
  Filled 2018-06-14: qty 1

## 2018-06-14 MED ORDER — DEXAMETHASONE 4 MG PO TABS: 4.0000 mg | ORAL_TABLET | Freq: Two times a day (BID) | ORAL | 0 refills | Status: DC

## 2018-06-14 MED ORDER — ONDANSETRON HCL 4 MG PO TABS
4.0000 mg | ORAL_TABLET | Freq: Once | ORAL | Status: AC
  Administered 2018-06-14: 4 mg via ORAL
  Filled 2018-06-14: qty 1

## 2018-06-14 MED ORDER — CYCLOBENZAPRINE HCL 10 MG PO TABS: 10.0000 mg | ORAL_TABLET | Freq: Three times a day (TID) | ORAL | 0 refills | Status: DC

## 2018-06-14 NOTE — ED Triage Notes (Signed)
Pt reports recent right knee surgery.  Has been having pain and swelling in both knees and they gave out yesterday and caused him to fall.

## 2018-06-14 NOTE — Discharge Instructions (Signed)
Your x-ray shows fluid in the joint/effusion.  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  Please elevate your knee above your waist as much as possible.  Please discuss the findings of the x-ray today with Dr. Romeo Apple.  Please see your primary physician for assistance with pain control.  Use Decadron 2 times daily with a meal.  Use Flexeril 3 times daily for spasm area pain.  Use Tylenol extra strength every 4 hours.  May use Ultram for more severe pain.  Ultram and Flexeril may cause drowsiness.  Please do not drive a vehicle, drink alcohol, operate machinery, or participate in activities requiring concentration when taking either these medications.

## 2018-06-14 NOTE — ED Provider Notes (Signed)
Patient got out of bed this morning and his left knee "gave way" he complains of pain at left knee at medial and lateral aspects.  He did not fall directly on his knee.  On exam he is mildly swollen.,  Not warm or red.  He is tender at medial and lateral aspects of the knee.  No tenderness over patella.  Negative Lockman's test.  Negative posterior drawer test.  No collateral weakness.  DP pulse 2+.   Doug Sou, MD 06/14/18 1055

## 2018-06-14 NOTE — ED Provider Notes (Addendum)
[facility]ERGENCY DEPARTMENT Provider Note   CSN: [id number] Arrival date & time: 06/14/18  [id number]     History   Chief Complaint Chief Complaint  Patient presents with  . Knee Pain    HPI Samuel [name] is a 68 y.o. male.  Patient is a 68 year old male who presents to the emergency department with a complaint of left knee pain.  The patient states that about 9 days ago he had surgery on his right  knee by Dr. Romeo AppleHarrison.  He was diagnosed with a torn meniscus.  He says he is gotten along well with the surgery in the right knee.  The patient was evaluated on yesterday January 24, and was told that the right knee is progressing nicely.  The sutures were removed.  The patient states that he felt as though his left knee gave away with him, and he has been having pain since that time.  He discussed it with Dr.Harrison.  The plan at this time is to address the left knee after the right knee has done more healing.  The patient states he is having a great deal of pain.  He cannot rest, and it is interfering with his sleep.  He presents to the emergency department for assistance with this issue.  He has not had hot joint.  He is not had any operations or procedures involving the left knee.  No recent fever or chills reported.  He has not had numbness or tingling, or inability to use the left knee.  The history is provided by the patient.  Knee Pain  Associated symptoms: no back pain and no neck pain     Past Medical History:  Diagnosis Date  . Arthritis   . COPD (chronic obstructive pulmonary disease) (HCC)   . Hypothyroidism     Patient Active Problem List   Diagnosis Date Noted  . 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 Meniscus, lateral, derangement, right   . Primary osteoarthritis of left knee     Past Surgical History:  Procedure Laterality Date  . ELBOW SURGERY Left 2002   ulnar nerve transposition  . KNEE ARTHROSCOPY WITH LATERAL MENISECTOMY Right  06/05/2018   Procedure: KNEE ARTHROSCOPY WITH LATERAL MENISCECTOMY and MEDIAL MENSISCECTOMY;  Surgeon: Vickki HearingHarrison, Stanley E, MD;  Location: [facility];  Service: Orthopedics;  Laterality: Right;  . LAMINECTOMY    . SPINAL FUSION     has cervical fusion and lumbar fusion 6 lumbar surgeries and 3 cervical surgeries        Home Medications    Prior to Admission medications   Medication Sig Start Date End Date Taking? Authorizing Provider  Cholecalciferol (VITAMIN D3 PO) Take 1 capsule by mouth 2 (two) times a week.    [provider]  gabapentin (NEURONTIN) 600 MG tablet Take 600 mg by mouth 3 (three) times daily. 01/27/18   [provider]  levothyroxine (SYNTHROID, LEVOTHROID) 200 MCG tablet Take 400 mcg by mouth daily before breakfast.     [provider]  naproxen (NAPROSYN) 500 MG tablet Take 500 mg by mouth 2 (two) times daily. 01/27/18   [provider]  oxyCODONE 10 MG TABS 1 every 4 as needed pain 06/05/18   Vickki HearingHarrison, Stanley E, MD  traZODone (DESYREL) 100 MG tablet Take 100 mg by mouth at bedtime.    [provider]    Family History Family History  Problem Relation Age of Onset  . Heart disease  Mother   . Lung disease Father     Social History Social History   Tobacco Use  . Smoking status: Never Smoker  . Smokeless tobacco: Never Used  Substance Use Topics  . Alcohol use: Never    Frequency: Never  . Drug use: Never     Allergies   Patient has no known allergies.   Review of Systems Review of Systems  Constitutional: Negative for activity change.       All ROS Neg except as noted in HPI  HENT: Negative for nosebleeds.   Eyes: Negative for photophobia and discharge.  Respiratory: Negative for cough, shortness of breath and wheezing.   Cardiovascular: Negative for chest pain and palpitations.  Gastrointestinal: Negative for abdominal pain and blood in stool.  Genitourinary: Negative for dysuria, frequency and hematuria.    Musculoskeletal: Positive for arthralgias. Negative for back pain and neck pain.  Skin: Negative.   Neurological: Negative for dizziness, seizures and speech difficulty.  Psychiatric/Behavioral: Negative for confusion and hallucinations.     Physical Exam Updated Vital Signs BP (!) 177/83   Pulse 79   Temp 98 F (36.7 C) (Oral)   Resp 16   Ht 5\' 6"  (1.676 m)   Wt 75 kg   SpO2 97%   BMI 26.69 kg/m   Physical Exam Vitals signs and nursing note reviewed.  Constitutional:      Appearance: He is well-developed. He is not toxic-appearing.  HENT:     Head: Normocephalic.     Right Ear: Tympanic membrane and external ear normal.     Left Ear: Tympanic membrane and external ear normal.  Eyes:     General: Lids are normal.     Pupils: Pupils are equal, round, and reactive to light.  Neck:     Musculoskeletal: Normal range of motion and neck supple.     Vascular: No carotid bruit.  Cardiovascular:     Rate and Rhythm: Normal rate and regular rhythm.     Pulses: Normal pulses.     Heart sounds: Normal heart sounds.  Pulmonary:     Effort: No respiratory distress.     Breath sounds: Normal breath sounds.  Abdominal:     General: Bowel sounds are normal.     Palpations: Abdomen is soft.     Tenderness: There is no abdominal tenderness. There is no guarding.  Musculoskeletal: Normal range of motion.     Comments: There are no palpable lymph nodes of the left inguinal area.  The patient has a brace on the left knee.  Brace was moved for examination.  The patient has pain to palpation at the medial and lateral aspect of the knee.  There is a mild effusion present.  There is no pain to the anterior tibial area.  There is no pain or hematoma noted of the quadricep area.  There is pain to the posterior portion of the knee.  There is no swelling of the lower leg.  There is negative Homans sign.  The Achilles tendon is intact.  The dorsalis pedis pulse is 2+.  The surgery site of the  right knee appears to be healing nicely.  Good granulation tissue in place.  There is no red streaks or drainage from the surgery site.  The knee is not hot.  The dorsalis pedis pulse is 2+.  There is no swelling of the lower leg.  Negative Homans sign on the right.  Lymphadenopathy:     Head:  Right side of head: No submandibular adenopathy.     Left side of head: No submandibular adenopathy.     Cervical: No cervical adenopathy.  Skin:    General: Skin is warm and dry.  Neurological:     Mental Status: He is alert and oriented to person, place, and time.     Cranial Nerves: No cranial nerve deficit.     Sensory: No sensory deficit.     Comments: No acute motor or sensory deficits appreciated of the lower extremities.  Psychiatric:        Speech: Speech normal.      ED Treatments / Results  Labs (all labs ordered are listed, but only abnormal results are displayed) Labs Reviewed - No data to display  EKG None  Radiology No results found.  Procedures Procedures (including critical care time)  Medications Ordered in ED Medications  dexamethasone (DECADRON) injection 10 mg (has no administration in time range)  ketorolac (TORADOL) tablet 10 mg (has no administration in time range)  HYDROcodone-acetaminophen (NORCO/VICODIN) 5-325 MG per tablet 2 tablet (has no administration in time range)  cyclobenzaprine (FLEXERIL) tablet 10 mg (has no administration in time range)  ondansetron (ZOFRAN) tablet 4 mg (has no administration in time range)     Initial Impression / Assessment and Plan / ED Course  I have reviewed the triage vital signs and the nursing notes.  Pertinent labs & imaging results that were available during my care of the patient were reviewed by me and considered in my medical decision making (see chart for details).       Final Clinical Impressions(s) / ED Diagnoses MDM Pt seen with me by Dr. Shela Commons. Blood pressure is elevated at 177/83.  I have asked the  patient to see his primary physician for recheck.  Vital signs are otherwise within normal limits.  Pulse oximetry is 97% on room air.  Within normal limits by my interpretation.  The patient was evaluated for his right knee surgery on yesterday.  He discussed his left knee pain and left knee giving out with Dr. Romeo Apple.  And the plan is to address this after the right knee has had some chance to heal.  The patient states that he is having increasing pain.  No acute problems noted of the left knee and lower extremity on examination at this time.  Patient has multiple sites of pain.  No hot joints appreciated.  No deformity appreciated.  No neurovascular deficits appreciated. X-ray of the left knee shows moderate to large knee effusion.  There is mild infiltrative prepatellar subcutaneous edema.  There is mild tricompartmental spurring present.  It is recommended that the patient have an MRI if the pain is not improved with conservative measures.  I discussed the exam finding and the x-ray findings with the patient.  I have asked him to discuss these with Dr. Romeo Apple as soon as possible..  Patient will be treated for his pain here in the emergency department.  I have asked the patient to contact his primary physician to discuss pain management.  Prescription for Decadron, and 12 tablets of Ultram given to the patient for the weekend until he can speak with his primary physician on January 27.   Final diagnoses:  Effusion of left knee  Acute pain of left knee    ED Discharge Orders    None       Ivery Quale, PA-C 06/14/18 1237    Ivery Quale, PA-C 06/14/18 1242  Doug Sou, MD 06/14/18 [phone]

## 2018-07-15 DIAGNOSIS — Z9889 Other specified postprocedural states: Secondary | ICD-10-CM | POA: Insufficient documentation

## 2018-07-18 ENCOUNTER — Encounter: Payer: Self-pay | Admitting: Orthopedic Surgery

## 2018-07-18 ENCOUNTER — Ambulatory Visit (INDEPENDENT_AMBULATORY_CARE_PROVIDER_SITE_OTHER): Payer: Medicare HMO | Admitting: Orthopedic Surgery

## 2018-07-18 DIAGNOSIS — Z9889 Other specified postprocedural states: Secondary | ICD-10-CM

## 2018-07-18 NOTE — Progress Notes (Signed)
[facility]AL [facility]IOD [facility]   POD #43  Encounter Diagnosis  Name Primary?  . S/P right knee arthroscopy 06/05/2018    Mr. Torrez is 68 years old is on multiple back surgeries he had a right knee arthroscopy he says it is doing well except for some soreness but he says his left knee is hurting now  Right knee:  No swelling mild varus deformity chronic no tenderness flexion arc 125 degrees knee is stable  06/05/2018  8:25 AM  PATIENT:  Sharion Balloon  68 y.o. 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  . S/P right knee arthroscopy 06/05/2018     Come in on 9 March x-ray left knee work-up left knee

## 2018-07-28 ENCOUNTER — Ambulatory Visit (INDEPENDENT_AMBULATORY_CARE_PROVIDER_SITE_OTHER): Payer: Medicare HMO

## 2018-07-28 ENCOUNTER — Ambulatory Visit: Payer: Medicare HMO | Admitting: Orthopedic Surgery

## 2018-07-28 ENCOUNTER — Encounter: Payer: Self-pay | Admitting: Orthopedic Surgery

## 2018-07-28 ENCOUNTER — Other Ambulatory Visit: Payer: Self-pay

## 2018-07-28 VITALS — BP 157/101 | HR 82 | Ht 66.0 in | Wt 184.0 lb

## 2018-07-28 DIAGNOSIS — M25462 Effusion, left knee: Secondary | ICD-10-CM

## 2018-07-28 DIAGNOSIS — M25562 Pain in left knee: Secondary | ICD-10-CM | POA: Diagnosis not present

## 2018-07-28 NOTE — Progress Notes (Signed)
ESTABLISHED PATIENT NEW PROBLEM OFFICE VISIT  Chief Complaint  Patient presents with  . Knee Pain-LEFT    68 yo male s/p right knee arthroscopy c/o left knee pain   Mr. Preusser had an arthroscopy of his right knee on January 16 about a week later his right knee gave out left knee gave out and he developed acute pain subsequent swelling decreased and loss of motion and painful weightbearing with anteromedial and posterior pain.  Treatment to date include chronic pain medication oxycodone and knee brace   Review of Systems  Musculoskeletal: Positive for back pain and joint pain.  Neurological: Negative for tingling, focal weakness and weakness.     Past Medical History:  Diagnosis Date  . Arthritis   . COPD (chronic obstructive pulmonary disease) (HCC)   . Hypothyroidism     Past Surgical History:  Procedure Laterality Date  . ELBOW SURGERY Left 2002   ulnar nerve transposition  . KNEE ARTHROSCOPY WITH LATERAL MENISECTOMY Right 06/05/2018   Procedure: KNEE ARTHROSCOPY WITH LATERAL MENISCECTOMY and MEDIAL MENSISCECTOMY;  Surgeon: Vickki Hearing, MD;  Location: [facility];  Service: Orthopedics;  Laterality: Right;  . LAMINECTOMY    . SPINAL FUSION     has cervical fusion and lumbar fusion 6 lumbar surgeries and 3 cervical surgeries    Family History  Problem Relation Age of Onset  . Heart disease Mother   . Lung disease Father    Social History   Tobacco Use  . Smoking status: Never Smoker  . Smokeless tobacco: Never Used  Substance Use Topics  . Alcohol use: Never    Frequency: Never  . Drug use: Never    No Known Allergies  Current Meds  Medication Sig  . Cholecalciferol (VITAMIN D3 PO) Take 1 capsule by mouth 2 (two) times daily.   Marland Kitchen gabapentin (NEURONTIN) 600 MG tablet Take 600 mg by mouth 3 (three) times daily.  Marland Kitchen levothyroxine (SYNTHROID, LEVOTHROID) 200 MCG tablet Take 400 mcg by mouth daily before breakfast.   . naproxen (NAPROSYN) 500 MG tablet Take  500 mg by mouth 2 (two) times daily.  Marland Kitchen oxyCODONE 10 MG TABS 1 every 4 as needed pain (Patient taking differently: Take 10 mg by mouth every 4 (four) hours as needed (pain). )  . traZODone (DESYREL) 100 MG tablet Take 100 mg by mouth at bedtime.    BP (!) 157/101   Pulse 82   Ht 5\' 6"  (1.676 m)   Wt 184 lb (83.5 kg)   BMI 29.70 kg/m   Physical Exam Vitals signs reviewed.  Constitutional:      Appearance: Normal appearance. He is well-developed.  Skin:    General: Skin is warm and dry.     Findings: No erythema.  Neurological:     Mental Status: He is alert and oriented to person, place, and time.     Gait: Gait abnormal.     Comments: Antalgic gait right knee left knee  Psychiatric:        Mood and Affect: Mood and affect normal.     Ortho Exam  Right knee prior arthroscopy portals have healed.  There is no effusion.  His range of motion is 120 degrees of flexion with slight flexion contracture.  The ligaments in the knee are stable he has good muscle tone no atrophy or tremor the skin is intact as described he is a good distal pulse and no sensory deficit  In the left knee we have a large joint  effusion his range of motion is 5-90 has a chronic flexion contracture anyway has painful knee extension and a block to extension his ligaments are stable his muscle tone and strength are normal there is no tremor or atrophy the skin is clean pulses are normal distally the temperature is warm sensory normal  MEDICAL DECISION SECTION  Xrays were done at Office on January 25 but he has subsequently hurt the knee so we are repeating the film My independent reading of xrays:  Reasonable alignment may be trending towards varus with slight joint space narrowing of the medial compartment no osteophytes are seen.   Encounter Diagnoses  Name Primary?  . Acute pain of left knee Yes  . Effusion of knee joint, left     PLAN:  Recommend an over-the-counter NSAID to go with his  oxycodone  Continue bracing  I aspirated 50 cc of clear yellow fluid from the knee and injected him with cortisone  Procedure note injection and aspiration left knee joint  Verbal consent was obtained to aspirate and inject the left knee joint   Timeout was completed to confirm the site of aspiration and injection  An 18-gauge needle was used to aspirate the left knee joint from a suprapatellar lateral approach.  The medications used were 40 mg of Depo-Medrol and 1% lidocaine 3 cc  Anesthesia was provided by ethyl chloride and the skin was prepped with alcohol.  After cleaning the skin with alcohol an 18-gauge needle was used to aspirate the right knee joint.  We obtained 50 cc of fluid  We followed this by injection of 40 mg of Depo-Medrol and 3 cc 1% lidocaine.  There were no complications. A sterile bandage was applied.  Recommend 4-week follow-up if no improvement we can certainly do an MRI No orders of the defined types were placed in this encounter.   [name], MD  07/28/2018 11:19 AM

## 2018-07-28 NOTE — Patient Instructions (Signed)
Wear the brace  Take an over-the-counter anti-inflammatory such as Advil or Aleve  Ice the knee twice a day for 20 to 30 minutes  Limit your activities to things that do not hurt or cause the knee to swell

## 2018-08-22 ENCOUNTER — Ambulatory Visit: Payer: Medicare HMO | Admitting: Orthopedic Surgery

## 2018-09-29 ENCOUNTER — Encounter (HOSPITAL_COMMUNITY): Payer: Self-pay

## 2018-09-29 ENCOUNTER — Emergency Department (HOSPITAL_COMMUNITY)
Admission: EM | Admit: 2018-09-29 | Discharge: 2018-09-29 | Disposition: A | Discharge status: Home / Self Care | Payer: Medicare HMO | Attending: Emergency Medicine | Admitting: Emergency Medicine

## 2018-09-29 ENCOUNTER — Emergency Department (HOSPITAL_COMMUNITY): Payer: Medicare HMO

## 2018-09-29 ENCOUNTER — Other Ambulatory Visit: Payer: Self-pay

## 2018-09-29 DIAGNOSIS — S199XXA Unspecified injury of neck, initial encounter: Secondary | ICD-10-CM | POA: Diagnosis present

## 2018-09-29 DIAGNOSIS — S61012A Laceration without foreign body of left thumb without damage to nail, initial encounter: Secondary | ICD-10-CM | POA: Diagnosis not present

## 2018-09-29 DIAGNOSIS — S61412A Laceration without foreign body of left hand, initial encounter: Secondary | ICD-10-CM | POA: Insufficient documentation

## 2018-09-29 DIAGNOSIS — S161XXA Strain of muscle, fascia and tendon at neck level, initial encounter: Secondary | ICD-10-CM | POA: Diagnosis not present

## 2018-09-29 DIAGNOSIS — Y93I9 Activity, other involving external motion: Secondary | ICD-10-CM | POA: Diagnosis not present

## 2018-09-29 DIAGNOSIS — S4992XA Unspecified injury of left shoulder and upper arm, initial encounter: Secondary | ICD-10-CM | POA: Diagnosis not present

## 2018-09-29 DIAGNOSIS — Y998 Other external cause status: Secondary | ICD-10-CM | POA: Diagnosis not present

## 2018-09-29 DIAGNOSIS — Z23 Encounter for immunization: Secondary | ICD-10-CM | POA: Diagnosis not present

## 2018-09-29 DIAGNOSIS — Y92410 Unspecified street and highway as the place of occurrence of the external cause: Secondary | ICD-10-CM | POA: Diagnosis not present

## 2018-09-29 MED ORDER — CYCLOBENZAPRINE HCL 10 MG PO TABS: 10.0000 mg | ORAL_TABLET | Freq: Three times a day (TID) | ORAL | 0 refills | Status: DC | PRN

## 2018-09-29 MED ORDER — LIDOCAINE HCL (PF) 2 % IJ SOLN
INTRAMUSCULAR | Status: AC
  Filled 2018-09-29: qty 20

## 2018-09-29 MED ORDER — OXYCODONE-ACETAMINOPHEN 5-325 MG PO TABS
1.0000 | ORAL_TABLET | Freq: Once | ORAL | Status: AC
  Administered 2018-09-29: 1 via ORAL
  Filled 2018-09-29: qty 1

## 2018-09-29 MED ORDER — BACITRACIN ZINC 500 UNIT/GM EX OINT
TOPICAL_OINTMENT | CUTANEOUS | Status: AC
  Administered 2018-09-29: 22:00:00
  Filled 2018-09-29: qty 0.9

## 2018-09-29 MED ORDER — TETANUS-DIPHTH-ACELL PERTUSSIS 5-2.5-18.5 LF-MCG/0.5 IM SUSP
0.5000 mL | Freq: Once | INTRAMUSCULAR | Status: AC
  Administered 2018-09-29: 0.5 mL via INTRAMUSCULAR
  Filled 2018-09-29: qty 0.5

## 2018-09-29 MED ORDER — LIDOCAINE HCL (PF) 2 % IJ SOLN
5.0000 mL | Freq: Once | INTRAMUSCULAR | Status: AC
  Administered 2018-09-29: 5 mL via INTRADERMAL
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
## 2018-09-29 NOTE — Discharge Instructions (Addendum)
Apply ice packs on/off to your neck.  You may alternate with heat after 2 days.  Clean the wounds with mild soap and water and keep them bandaged.  Sutures out in 8-10 days.  Return here if needed
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
## 2018-10-03 ENCOUNTER — Ambulatory Visit: Payer: Self-pay | Admitting: Orthopedic Surgery

## 2018-10-03 ENCOUNTER — Telehealth: Payer: Self-pay | Admitting: Orthopedic Surgery
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
## 2018-10-06 ENCOUNTER — Ambulatory Visit: Payer: Medicare HMO | Admitting: Orthopedic Surgery

## 2018-10-07 ENCOUNTER — Encounter: Payer: Self-pay | Admitting: Orthopedic Surgery

## 2018-10-20 ENCOUNTER — Ambulatory Visit (INDEPENDENT_AMBULATORY_CARE_PROVIDER_SITE_OTHER): Payer: Medicare HMO | Admitting: Orthopedic Surgery

## 2018-10-20 ENCOUNTER — Encounter: Payer: Self-pay | Admitting: Orthopedic Surgery

## 2018-10-20 ENCOUNTER — Other Ambulatory Visit: Payer: Self-pay

## 2018-10-20 VITALS — BP 170/86 | HR 72 | Temp 98.3°F | Ht 66.0 in | Wt 163.0 lb

## 2018-10-20 DIAGNOSIS — S83242D Other tear of medial meniscus, current injury, left knee, subsequent encounter: Secondary | ICD-10-CM

## 2018-10-20 DIAGNOSIS — M5136 Other intervertebral disc degeneration, lumbar region: Secondary | ICD-10-CM | POA: Diagnosis not present

## 2018-10-20 DIAGNOSIS — Z9889 Other specified postprocedural states: Secondary | ICD-10-CM

## 2018-10-20 DIAGNOSIS — M1712 Unilateral primary osteoarthritis, left knee: Secondary | ICD-10-CM

## 2018-10-20 DIAGNOSIS — M25462 Effusion, left knee: Secondary | ICD-10-CM | POA: Diagnosis not present

## 2018-10-20 NOTE — Progress Notes (Signed)
Progress Note   Patient ID: Samuel [name], male   DOB: 08-04-1950, 68 y.o.   MRN: [id number]   Chief Complaint  Patient presents with  . Knee Pain    f/u until he can get in for Lt knee scope    HPI The patient presents for evaluation of his left knee.  He did well with the right knee arthroscopy.  Postoperatively complained of left knee pain medial joint line severe including posterior pain swelling and giving way.  Although he is on oxycodone he has pain 8-9 out of 10 with mechanical symptoms.  We were going to get an MRI but COVID-19 hip and we had to delay it.  He is finally back in the office for reevaluation complaining of exact same symptoms   Review of Systems  Constitutional: Negative for fever.  Musculoskeletal: Positive for back pain and joint pain.   Current Meds  Medication Sig  . Cholecalciferol (VITAMIN D3) 10 MCG (400 UNIT) CAPS Take 1 capsule by mouth 2 (two) times daily.   . cyclobenzaprine (FLEXERIL) 10 MG tablet Take 1 tablet (10 mg total) by mouth 3 (three) times daily as needed.  . gabapentin (NEURONTIN) 600 MG tablet Take 600 mg by mouth 3 (three) times daily.  Marland Kitchen levothyroxine (SYNTHROID, LEVOTHROID) 200 MCG tablet Take 400 mcg by mouth daily before breakfast.   . naproxen (NAPROSYN) 500 MG tablet Take 500 mg by mouth 2 (two) times daily.  Marland Kitchen oxyCODONE (OXY IR/ROXICODONE) 5 MG immediate release tablet   . traZODone (DESYREL) 100 MG tablet Take 100 mg by mouth at bedtime.    Past Medical History:  Diagnosis Date  . Arthritis   . COPD (chronic obstructive pulmonary disease) (HCC)   . Hypothyroidism      No Known Allergies   BP (!) 170/86   Pulse 72   Temp 98.3 F (36.8 C)   Ht 5\' 6"  (1.676 m)   Wt 163 lb (73.9 kg)   BMI 26.31 kg/m    Physical Exam Musculoskeletal:     Left knee: He exhibits effusion.    General appearance normal Oriented x3 normal Mood pleasant affect normal Gait: limping left knee; varus thrust mild   Left Knee Exam    Muscle Strength  The patient has normal left knee strength.  Tenderness  The patient is experiencing tenderness in the medial joint line.  Range of Motion  Extension: 10  Left knee flexion: 125.   Tests  McMurray:  Medial - positive  Varus: negative Valgus: negative Lachman:  Anterior - negative     Drawer:  Anterior - negative     Posterior - negative Patellar apprehension: negative  Other  Erythema: absent Scars: absent Sensation: normal Pulse: present Swelling: none Effusion: effusion present     Right knee  Inspection and palpation revealed no abnormalities Range of motion is full No instability was detected on stress testing Muscle tone and strength was normal without tremor Skin was warm dry and intact Good pulse and temperature were noted in the extremity Sensation revealed no abnormalities to light touch      MEDICAL DECISION MAKING   Imaging:  X-ray shows a varus knee with osteoarthritis medial compartment X-ray report Left knee x-ray report   The x-ray done standing shows the patient had a moderate varus deformity of his left knee with joint space narrowing moderate primary in the medial compartment has mild subchondral sclerosis without secondary bone abnormalities.   We do see some lateral compartment  patellofemoral joint space narrowing some osteophytes on the femur on the axial x-ray   Impression varus knee mild to moderate moderate arthritis medial compartment   Encounter Diagnoses  Name Primary?  . Effusion of knee joint, left   . S/P right knee arthroscopy 06/05/2018   . Degenerative disc disease, lumbar   . Acute medial meniscus tear of left knee, subsequent encounter Yes  . 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  10:29 AM 10/20/2018

## 2018-10-20 NOTE — Patient Instructions (Signed)
SCHED MRI LEFT KNEE

## 2018-10-21 ENCOUNTER — Telehealth: Payer: Self-pay | Admitting: Radiology

## 2018-10-21 NOTE — Telephone Encounter (Signed)
FYI:  Patient called and I relayed his MRI appointment information. Patient states Dr. Romeo Apple told him yesterday he would call him with results.

## 2018-10-21 NOTE — Telephone Encounter (Signed)
Dr Romeo Apple told me in the exam room he needs to follow up in person for the results, so we can book him for surgery.

## 2018-10-21 NOTE — Telephone Encounter (Signed)
I called patient about the MRI Wed June 10th at 4 pm arrive at 3:30 need to make follow up to review, discuss surgery Phone rang x 3 then cut out

## 2018-10-29 ENCOUNTER — Other Ambulatory Visit: Payer: Self-pay

## 2018-10-29 ENCOUNTER — Ambulatory Visit (HOSPITAL_COMMUNITY)
Admission: RE | Admit: 2018-10-29 | Discharge: 2018-10-29 | Disposition: A | Discharge status: Home / Self Care | Payer: Medicare HMO | Source: Ambulatory Visit | Attending: Orthopedic Surgery | Admitting: Orthopedic Surgery

## 2018-10-29 DIAGNOSIS — M25462 Effusion, left knee: Secondary | ICD-10-CM | POA: Diagnosis not present

## 2018-10-29 DIAGNOSIS — M1712 Unilateral primary osteoarthritis, left knee: Secondary | ICD-10-CM | POA: Diagnosis present

## 2018-11-03 ENCOUNTER — Other Ambulatory Visit: Payer: Self-pay | Admitting: Orthopedic Surgery

## 2018-11-03 ENCOUNTER — Telehealth: Payer: Self-pay | Admitting: Radiology

## 2018-11-03 NOTE — Telephone Encounter (Signed)
-----   Message from Carole Civil, MD sent at 11/03/2018 12:42 PM EDT ----- SALK MED AND LAT MENISECT 25 TH IF POSS

## 2018-11-03 NOTE — Telephone Encounter (Signed)
Spoke to him put in orders.

## 2018-11-03 NOTE — Telephone Encounter (Signed)
25th looks good, left message for patient to call me back

## 2018-11-05 ENCOUNTER — Telehealth: Payer: Self-pay | Admitting: Radiology

## 2018-11-05 NOTE — Telephone Encounter (Signed)
with [facility] as long as the doctor and facility are in network no prior Josem Kaufmann is required for CPT code 29881 knee arthroscopy

## 2018-11-07 NOTE — Patient Instructions (Signed)
Samuel [name]  11/07/2018     @PREFPERIOPPHARMACY @   Your procedure is scheduled on  11/13/2018.  Report to [facility] at  1125  A.M.  Call this number if you have problems the morning of surgery:  [phone]   Remember:  Do not eat or drink after midnight.                         Take these medicines the morning of surgery with A SIP OF WATER   Flexaril(if needed), gabapentin, levothyroxine, oxycodone(if needed).    Do not wear jewelry, make-up or nail polish.  Do not wear lotions, powders, or perfumes, or deodorant.  Do not shave 48 hours prior to surgery.  Men may shave face and neck.  Do not bring valuables to the hospital.  [facility] is not responsible for any belongings or valuables.  Contacts, dentures or bridgework may not be worn into surgery.  Leave your suitcase in the car.  After surgery it may be brought to your room.  For patients admitted to the hospital, discharge time will be determined by your treatment team.  Patients discharged the day of surgery will not be allowed to drive home.   Name and phone number of your driver:   family Special instructions:  None  Please read over the following fact sheets that you were given. Anesthesia Post-op Instructions and Care and Recovery After Surgery       Arthroscopic Knee Ligament Repair, Care After This sheet gives you information about how to care for yourself after your procedure. Your health care provider may also give you more specific instructions. If you have problems or questions, contact your health care provider. What can I expect after the procedure? After the procedure, it is common to have:  Pain in your knee.  Bruising and swelling on your knee, calf, and ankle for 3-4 days.  Fatigue. Follow these instructions at home: If you have a brace or immobilizer:  Wear the brace or immobilizer as told by your health care provider. Remove it only as told by your health care provider.   Loosen the splint or immobilizer if your toes tingle, become numb, or turn cold and blue.  Keep the brace or immobilizer clean. Bathing  Do not take baths, swim, or use a hot tub until your health care provider approves. Ask your health care provider if you can take showers.  Keep your bandage (dressing) dry until your health care provider says that it can be removed. Cover it and your brace or immobilizer with a watertight covering when you take a shower. Incision care   Follow instructions from your health care provider about how to take care of your incision. Make sure you: ? Wash your hands with soap and water before you change your bandage (dressing). If soap and water are not available, use hand sanitizer. ? Change your dressing as told by your health care provider. ? Leave stitches (sutures), skin glue, or adhesive strips in place. These skin closures may need to stay in place for 2 weeks or longer. If adhesive strip edges start to loosen and curl up, you may trim the loose edges. Do not remove adhesive strips completely unless your health care provider tells you to do that.  Check your incision area every day for signs of infection. Check for: ? More redness, swelling, or pain. ? More fluid or blood. ?  Warmth. ? Pus or a bad smell. Managing pain, stiffness, and swelling   If directed, put ice on the affected area. ? If you have a removable brace or immobilizer, remove it as told by your health care provider. ? Put ice in a plastic bag. ? Place a towel between your skin and the bag or between your brace or immobilizer and the bag. ? Leave the ice on for 20 minutes, 2-3 times a day.  Move your toes often to avoid stiffness and to lessen swelling.  Raise (elevate) the injured area above the level of your heart while you are sitting or lying down. Driving  Do not drive until your health care provider approves. If you have a brace or immobilizer on your leg, ask your health care  provider when it is safe for you to drive.  Do not drive or use heavy machinery while taking prescription pain medicine. Activity  Rest as directed. Ask your health care provider what activities are safe for you.  Do physical therapy exercises as told by your health care provider. Physical therapy will help you regain strength and motion in your knee.  Follow instructions from your health care provider about: ? When you may start motion exercises. ? When you may start riding a stationary bike and doing other low-impact activities. ? When you may start to jog and do other high-impact activities. Safety  Do not use the injured limb to support your body weight until your health care provider says that you can. Use crutches as told by your health care provider. General instructions  Do not use any products that contain nicotine or tobacco, such as cigarettes and e-cigarettes. These can delay bone healing. If you need help quitting, ask your health care provider.  To prevent or treat constipation while you are taking prescription pain medicine, your health care provider may recommend that you: ? Drink enough fluid to keep your urine clear or pale yellow. ? Take over-the-counter or prescription medicines. ? Eat foods that are high in fiber, such as fresh fruits and vegetables, whole grains, and beans. ? Limit foods that are high in fat and processed sugars, such as fried and sweet foods.  Take over-the-counter and prescription medicines only as told by your health care provider.  Keep all follow-up visits as told by your health care provider. This is important. Contact a health care provider if:  You have more redness, swelling, or pain around an incision.  You have more fluid or blood coming from an incision.  Your incision feels warm to the touch.  You have a fever.  You have pain or swelling in your knee, and it gets worse.  You have pain that does not get better with medicine.  Get help right away if:  You have trouble breathing.  You have pus or a bad smell coming from an incision.  You have numbness and tingling near the knee joint. Summary  After the procedure, it is common to have knee pain with bruising and swelling on your knee, calf, and ankle.  Icing your knee and raising your leg above the level of your heart will help control the pain and the swelling.  Do physical therapy exercises as told by your health care provider. Physical therapy will help you regain strength and motion in your knee. This information is not intended to replace advice given to you by your health care provider. Make sure you discuss any questions you have with your health care  provider. Document Released: 02/25/2013 Document Revised: 05/01/2016 Document Reviewed: 05/01/2016 Elsevier Interactive Patient Education  2019 [place] Anesthesia, Adult, Care After This sheet gives you information about how to care for yourself after your procedure. Your health care provider may also give you more specific instructions. If you have problems or questions, contact your health care provider. What can I expect after the procedure? After the procedure, the following side effects are common:  Pain or discomfort at the IV site.  Nausea.  Vomiting.  Sore throat.  Trouble concentrating.  Feeling cold or chills.  Weak or tired.  Sleepiness and fatigue.  Soreness and body aches. These side effects can affect parts of the body that were not involved in surgery. Follow these instructions at home:  For at least 24 hours after the procedure:  Have a responsible adult stay with you. It is important to have someone help care for you until you are awake and alert.  Rest as needed.  Do not: ? Participate in activities in which you could fall or become injured. ? Drive. ? Use heavy machinery. ? Drink alcohol. ? Take sleeping pills or medicines that cause drowsiness. ?  Make important decisions or sign legal documents. ? Take care of children on your own. Eating and drinking  Follow any instructions from your health care provider about eating or drinking restrictions.  When you feel hungry, start by eating small amounts of foods that are soft and easy to digest (bland), such as toast. Gradually return to your regular diet.  Drink enough fluid to keep your urine pale yellow.  If you vomit, rehydrate by drinking water, juice, or clear broth. General instructions  If you have sleep apnea, surgery and certain medicines can increase your risk for breathing problems. Follow instructions from your health care provider about wearing your sleep device: ? Anytime you are sleeping, including during daytime naps. ? While taking prescription pain medicines, sleeping medicines, or medicines that make you drowsy.  Return to your normal activities as told by your health care provider. Ask your health care provider what activities are safe for you.  Take over-the-counter and prescription medicines only as told by your health care provider.  If you smoke, do not smoke without supervision.  Keep all follow-up visits as told by your health care provider. This is important. Contact a health care provider if:  You have nausea or vomiting that does not get better with medicine.  You cannot eat or drink without vomiting.  You have pain that does not get better with medicine.  You are unable to pass urine.  You develop a skin rash.  You have a fever.  You have redness around your IV site that gets worse. Get help right away if:  You have difficulty breathing.  You have chest pain.  You have blood in your urine or stool, or you vomit blood. Summary  After the procedure, it is common to have a sore throat or nausea. It is also common to feel tired.  Have a responsible adult stay with you for the first 24 hours after general anesthesia. It is important to have  someone help care for you until you are awake and alert.  When you feel hungry, start by eating small amounts of foods that are soft and easy to digest (bland), such as toast. Gradually return to your regular diet.  Drink enough fluid to keep your urine pale yellow.  Return to your normal activities as told  by your health care provider. Ask your health care provider what activities are safe for you. This information is not intended to replace advice given to you by your health care provider. Make sure you discuss any questions you have with your health care provider. Document Released: 08/13/2000 Document Revised: 12/21/2016 Document Reviewed: 12/21/2016 Elsevier Interactive Patient Education  2019 [place]. How to Use Chlorhexidine Before Surgery Chlorhexidine gluconate (CHG) is a germ-killing (antiseptic) solution that is used to clean the skin. It gets rid of the bacteria that normally live on the skin. Cleaning your skin with CHG before surgery helps lower the risk for infection after surgery. To clean your skin before surgery, you may be given:  A CHG solution to use in the shower.  A prepackaged cloth that contains CHG. What are the risks? Risks of using CHG include:  A skin reaction.  Hearing loss, if CHG gets in your ears.  Eye injury, if CHG gets in your eyes and is not rinsed out.  The CHG product catching fire. Make sure that you avoid smoking and flames after applying CHG to your skin. Do not use CHG:  If you have a chlorhexidine allergy or have previously reacted to chlorhexidine.  On babies younger than 87 months of age. How to use CHG solution   Use CHG only as told by your health care provider, and follow the instructions on the label.  Use CHG solution while taking a shower. Follow these steps when using CHG solution (unless your health care provider gives you different instructions): 1. Start the shower. 2. Use your normal soap and shampoo to wash your  face and hair. 3. Turn off the shower or move out of the shower stream. 4. Pour the CHG onto a clean washcloth. Do not use any type of brush or rough-edged sponge. 5. Starting at your neck, lather your body down to your toes. Make sure you:  Pay special attention to the part of your body where you will be having surgery. Scrub this area for at least 1 minute.  Use the full amount of CHG as directed. Usually, this is one bottle.  Do not use CHG on your head or face. If the solution gets into your ears or eyes, rinse them well with water.  Avoid your genital area.  Avoid any areas of skin that have broken skin, cuts, or scrapes.  Scrub your back and under your arms. Make sure to wash skin folds. 6. Let the lather sit on your skin for 1-2 minutes or as long as told by your health care provider. 7. Thoroughly rinse your entire body in the shower. Make sure that all body creases and crevices are rinsed well. 8. Dry off with a clean towel. Do not put any substances on your body afterward, such as powder, lotion, or perfume. 9. Put on clean clothes or pajamas. 10. If it is the night before your surgery, sleep in clean sheets. How to use CHG prepackaged cloths   Only use CHG cloths as told by your health care provider, and follow the instructions on the label.  Use the CHG cloth on clean, dry skin. Follow these steps when using a CHG cloth (unless your health care provider gives you different instructions): 1. Using the CHG cloth, vigorously scrub the part of your body where you will be having surgery. Scrub using a back-and-forth motion for 3 minutes. The area on your body should be completely wet with CHG when you are done scrubbing.  2. Do not rinse. Discard the cloth and let the area air-dry for 1 minute. Do not put any substances on your body afterward, such as powder, lotion, or perfume. 3. Put on clean clothes or pajamas. 4. If it is the night before your surgery, sleep in clean sheets.  Contact a health care provider if:  Your skin gets irritated after scrubbing.  You have questions about using your solution or cloth. Get help right away if:  Your eyes become very red or swollen.  Your eyes itch badly.  Your skin itches badly and is red or swollen.  Your hearing changes.  You have trouble seeing.  You have swelling or tingling in your mouth or throat.  You have trouble breathing.  You swallow any chlorhexidine. Summary  Chlorhexidine gluconate (CHG) is a germ-killing (antiseptic) solution that is used to clean the skin. Cleaning your skin with CHG before surgery helps lower the risk for infection after surgery.  You may be given CHG to use at home. It may be in a bottle or in a prepackaged cloth to use on your skin. Carefully follow your health care provider's instructions and the instructions on the product label.  Do not use CHG if you have a chlorhexidine allergy.  Contact your health care provider if your skin gets irritated after scrubbing. This information is not intended to replace advice given to you by your health care provider. Make sure you discuss any questions you have with your health care provider. Document Released: 01/30/2012 Document Revised: 04/04/2017 Document Reviewed: 04/04/2017 Elsevier Interactive Patient Education  2019 [employer].

## 2018-11-10 ENCOUNTER — Other Ambulatory Visit: Payer: Self-pay

## 2018-11-10 ENCOUNTER — Other Ambulatory Visit (HOSPITAL_COMMUNITY)
Admission: RE | Admit: 2018-11-10 | Discharge: 2018-11-10 | Disposition: A | Discharge status: Home / Self Care | Payer: Medicare HMO | Source: Ambulatory Visit | Attending: Orthopedic Surgery | Admitting: Orthopedic Surgery

## 2018-11-10 ENCOUNTER — Encounter (HOSPITAL_COMMUNITY): Payer: Self-pay

## 2018-11-10 ENCOUNTER — Encounter (HOSPITAL_COMMUNITY)
Admission: RE | Admit: 2018-11-10 | Discharge: 2018-11-10 | Disposition: A | Discharge status: Home / Self Care | Payer: Medicare HMO | Source: Ambulatory Visit | Attending: Orthopedic Surgery | Admitting: Orthopedic Surgery

## 2018-11-10 DIAGNOSIS — Z79891 Long term (current) use of opiate analgesic: Secondary | ICD-10-CM | POA: Diagnosis not present

## 2018-11-10 DIAGNOSIS — J449 Chronic obstructive pulmonary disease, unspecified: Secondary | ICD-10-CM | POA: Diagnosis not present

## 2018-11-10 DIAGNOSIS — E039 Hypothyroidism, unspecified: Secondary | ICD-10-CM | POA: Diagnosis not present

## 2018-11-10 DIAGNOSIS — Z1159 Encounter for screening for other viral diseases: Secondary | ICD-10-CM | POA: Insufficient documentation

## 2018-11-10 DIAGNOSIS — Z01812 Encounter for preprocedural laboratory examination: Secondary | ICD-10-CM | POA: Insufficient documentation

## 2018-11-10 DIAGNOSIS — J60 Coalworker's pneumoconiosis: Secondary | ICD-10-CM | POA: Diagnosis not present

## 2018-11-10 DIAGNOSIS — S83242A Other tear of medial meniscus, current injury, left knee, initial encounter: Secondary | ICD-10-CM | POA: Diagnosis not present

## 2018-11-10 DIAGNOSIS — X58XXXA Exposure to other specified factors, initial encounter: Secondary | ICD-10-CM | POA: Diagnosis not present

## 2018-11-10 DIAGNOSIS — Z79890 Hormone replacement therapy (postmenopausal): Secondary | ICD-10-CM | POA: Diagnosis not present

## 2018-11-10 DIAGNOSIS — Z79899 Other long term (current) drug therapy: Secondary | ICD-10-CM | POA: Diagnosis not present

## 2018-11-10 DIAGNOSIS — S83282A Other tear of lateral meniscus, current injury, left knee, initial encounter: Secondary | ICD-10-CM | POA: Diagnosis not present

## 2018-11-10 LAB — CBC WITH DIFFERENTIAL/PLATELET
Abs Immature Granulocytes: 0.05 10*3/uL (ref 0.00–0.07)
Basophils Absolute: 0.1 10*3/uL (ref 0.0–0.1)
Basophils Relative: 1 %
Eosinophils Absolute: 0.2 10*3/uL (ref 0.0–0.5)
Eosinophils Relative: 5 %
HCT: 38.1 % — ABNORMAL LOW (ref 39.0–52.0)
Hemoglobin: 12 g/dL — ABNORMAL LOW (ref 13.0–17.0)
Immature Granulocytes: 1 %
Lymphocytes Relative: 24 %
Lymphs Abs: 1.2 10*3/uL (ref 0.7–4.0)
MCH: 25.5 pg — ABNORMAL LOW (ref 26.0–34.0)
MCHC: 31.5 g/dL (ref 30.0–36.0)
MCV: 81.1 fL (ref 80.0–100.0)
Monocytes Absolute: 0.6 10*3/uL (ref 0.1–1.0)
Monocytes Relative: 13 %
Neutro Abs: 2.8 10*3/uL (ref 1.7–7.7)
Neutrophils Relative %: 56 %
Platelets: 375 10*3/uL (ref 150–400)
RBC: 4.7 MIL/uL (ref 4.22–5.81)
RDW: 20 % — ABNORMAL HIGH (ref 11.5–15.5)
WBC: 4.9 10*3/uL (ref 4.0–10.5)
nRBC: 0 % (ref 0.0–0.2)

## 2018-11-10 LAB — BASIC METABOLIC PANEL
Anion gap: 8 (ref 5–15)
BUN: 12 mg/dL (ref 8–23)
CO2: 24 mmol/L (ref 22–32)
Calcium: 8.3 mg/dL — ABNORMAL LOW (ref 8.9–10.3)
Chloride: 105 mmol/L (ref 98–111)
Creatinine, Ser: 0.75 mg/dL (ref 0.61–1.24)
GFR calc Af Amer: >60 mL/min (ref >60)
GFR calc non Af Amer: >60 mL/min (ref >60)
Glucose, Bld: 108 mg/dL — ABNORMAL HIGH (ref 70–99)
Potassium: 4.1 mmol/L (ref 3.5–5.1)
Sodium: 137 mmol/L (ref 135–145)

## 2018-11-11 LAB — NOVEL CORONAVIRUS, NAA (HOSP ORDER, SEND-OUT TO REF LAB; TAT 18-24 HRS): SARS-CoV-2, NAA: NOT DETECTED

## 2018-11-12 NOTE — H&P (Signed)
Progress Note    Patient ID: Samuel [name], male   DOB: Jan 11, 1951, 68 y.o.   MRN: [id number]     Chief Complaint  Patient presents with  . Knee Pain      f/u until he can get in for Lt knee scope     HPI The patient presents for evaluation of his left knee.  He did well with the right knee arthroscopy.  Postoperatively complained of left knee pain medial joint line severe including posterior pain swelling and giving way.  Although he is on oxycodone he has pain 8-9 out of 10 with mechanical symptoms.  We were going to get an MRI but COVID-19 hip and we had to delay it.  He is finally back in the office for reevaluation complaining of exact same symptoms     Review of Systems  Constitutional: Negative for fever.  Musculoskeletal: Positive for back pain and joint pain.    Active Medications      Current Meds  Medication Sig  . Cholecalciferol (VITAMIN D3) 10 MCG (400 UNIT) CAPS Take 1 capsule by mouth 2 (two) times daily.   . cyclobenzaprine (FLEXERIL) 10 MG tablet Take 1 tablet (10 mg total) by mouth 3 (three) times daily as needed.  . gabapentin (NEURONTIN) 600 MG tablet Take 600 mg by mouth 3 (three) times daily.  Marland Kitchen levothyroxine (SYNTHROID, LEVOTHROID) 200 MCG tablet Take 400 mcg by mouth daily before breakfast.   . naproxen (NAPROSYN) 500 MG tablet Take 500 mg by mouth 2 (two) times daily.  Marland Kitchen oxyCODONE (OXY IR/ROXICODONE) 5 MG immediate release tablet    . traZODone (DESYREL) 100 MG tablet Take 100 mg by mouth at bedtime.           Past Medical History:  Diagnosis Date  . Arthritis    . COPD (chronic obstructive pulmonary disease) ([place])    . Hypothyroidism         No Known Allergies     BP (!) 170/86   Pulse 72   Temp 98.3 F (36.8 C)   Ht 5\' 6"  (1.676 m)   Wt 163 lb (73.9 kg)   BMI 26.31 kg/m      Physical Exam Musculoskeletal:     Left knee: He exhibits effusion.      General appearance normal Oriented x3 normal Mood pleasant affect normal Gait:  limping left knee; varus thrust mild    Left Knee Exam    Muscle Strength  The patient has normal left knee strength.   Tenderness  The patient is experiencing tenderness in the medial joint line.   Range of Motion  Extension: 10  Left knee flexion: 125.    Tests  McMurray:  Medial - positive  Varus: negative Valgus: negative Lachman:  Anterior - negative     Drawer:  Anterior - negative     Posterior - negative Patellar apprehension: negative   Other  Erythema: absent Scars: absent Sensation: normal Pulse: present Swelling: none Effusion: effusion present         Right knee  Inspection and palpation revealed no abnormalities Range of motion is full No instability was detected on stress testing Muscle tone and strength was normal without tremor Skin was warm dry and intact Good pulse and temperature were noted in the extremity Sensation revealed no abnormalities to light touch           MEDICAL DECISION MAKING    Imaging:  X-ray shows a varus knee with osteoarthritis medial  compartment X-ray report Left knee x-ray report   The x-ray done standing shows the patient had a moderate varus deformity of his left knee with joint space narrowing moderate primary in the medial compartment has mild subchondral sclerosis without secondary bone abnormalities.   We do see some lateral compartment patellofemoral joint space narrowing some osteophytes on the femur on the axial x-ray   Impression varus knee mild to moderate moderate arthritis medial compartment         Encounter Diagnoses  Name Primary?  . Effusion of knee joint, left    . S/P right knee arthroscopy 06/05/2018    . Degenerative disc disease, lumbar    . Acute medial meniscus tear of left knee, subsequent encounter Yes  . Primary osteoarthritis of left knee       CLINICAL DATA:  Severe left knee pain for the past 5 months. No known injury.   EXAM: MRI OF THE LEFT KNEE WITHOUT CONTRAST    TECHNIQUE: Multiplanar, multisequence MR imaging of the knee was performed. No intravenous contrast was administered.   COMPARISON:  Left knee x-rays dated July 28, 2018.   FINDINGS: MENISCI   Medial meniscus: Near complete radial tear through the posterior Deweese with mild extrusion of the body.   Lateral meniscus:  Radial tears of the anterior and posterior horns.   LIGAMENTS   Cruciates:  Intact ACL and PCL.   Collaterals: Medial collateral ligament is intact. Lateral collateral ligament complex is intact.   CARTILAGE   Patellofemoral: High-grade partial-thickness cartilage loss over the trochlear groove.   Medial: Large areas of near full-thickness cartilage loss over the central weight-bearing medial femoral condyle and peripheral medial tibial plateau.   Lateral: Partial-thickness cartilage loss and full-thickness delamination over the posterior weight-bearing lateral femoral condyle and central lateral tibial plateau.   Joint: Moderate joint effusion. Edema within Hoffa's fat. Borderline thickened suprapatellar and medial plicae.   Popliteal Fossa: No significant Baker cyst. Intact popliteus tendon.   Extensor Mechanism: Intact quadriceps tendon and patellar tendon. Intact medial and lateral patellar retinaculum. Intact MPFL.   Bones: Subchondral insufficiency fracture in the peripheral medial tibial plateau with adjacent marrow edema. No dislocation. No suspicious bone lesion.   Other: None.   IMPRESSION: 1. Near complete radial tear through the medial meniscus posterior Tokarczyk with mild extrusion of the body. 2. Radial tears of the lateral meniscus anterior and posterior horns. 3. Subchondral insufficiency fracture of the medial tibial plateau. 4. 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   ARTHROSCOPY LEFT KNEE MEDIAL MENISECTOMY   11/12/2018  1:01 PM

## 2018-11-13 ENCOUNTER — Ambulatory Visit (HOSPITAL_COMMUNITY): Payer: Medicare HMO | Admitting: Anesthesiology

## 2018-11-13 ENCOUNTER — Ambulatory Visit (HOSPITAL_COMMUNITY)
Admission: RE | Admit: 2018-11-13 | Discharge: 2018-11-13 | Disposition: A | Discharge status: Home / Self Care | Payer: Medicare HMO | Attending: Orthopedic Surgery | Admitting: Orthopedic Surgery

## 2018-11-13 ENCOUNTER — Encounter (HOSPITAL_COMMUNITY)
Admission: RE | Disposition: A | Discharge status: Home / Self Care | Payer: Self-pay | Source: Home / Self Care | Attending: Orthopedic Surgery

## 2018-11-13 ENCOUNTER — Other Ambulatory Visit: Payer: Self-pay

## 2018-11-13 ENCOUNTER — Encounter (HOSPITAL_COMMUNITY): Payer: Self-pay | Admitting: *Deleted

## 2018-11-13 DIAGNOSIS — M23204 Derangement of unspecified medial meniscus due to old tear or injury, left knee: Secondary | ICD-10-CM | POA: Diagnosis not present

## 2018-11-13 DIAGNOSIS — J449 Chronic obstructive pulmonary disease, unspecified: Secondary | ICD-10-CM | POA: Diagnosis not present

## 2018-11-13 DIAGNOSIS — Z1159 Encounter for screening for other viral diseases: Secondary | ICD-10-CM | POA: Insufficient documentation

## 2018-11-13 DIAGNOSIS — J60 Coalworker's pneumoconiosis: Secondary | ICD-10-CM | POA: Diagnosis not present

## 2018-11-13 DIAGNOSIS — Z79890 Hormone replacement therapy (postmenopausal): Secondary | ICD-10-CM | POA: Insufficient documentation

## 2018-11-13 DIAGNOSIS — X58XXXA Exposure to other specified factors, initial encounter: Secondary | ICD-10-CM | POA: Insufficient documentation

## 2018-11-13 DIAGNOSIS — Z79899 Other long term (current) drug therapy: Secondary | ICD-10-CM | POA: Insufficient documentation

## 2018-11-13 DIAGNOSIS — S83282A Other tear of lateral meniscus, current injury, left knee, initial encounter: Secondary | ICD-10-CM | POA: Diagnosis not present

## 2018-11-13 DIAGNOSIS — E039 Hypothyroidism, unspecified: Secondary | ICD-10-CM | POA: Insufficient documentation

## 2018-11-13 DIAGNOSIS — S83242A Other tear of medial meniscus, current injury, left knee, initial encounter: Secondary | ICD-10-CM | POA: Diagnosis not present

## 2018-11-13 DIAGNOSIS — M1712 Unilateral primary osteoarthritis, left knee: Secondary | ICD-10-CM | POA: Diagnosis not present

## 2018-11-13 DIAGNOSIS — Z79891 Long term (current) use of opiate analgesic: Secondary | ICD-10-CM | POA: Insufficient documentation

## 2018-11-13 DIAGNOSIS — S83242D Other tear of medial meniscus, current injury, left knee, subsequent encounter: Secondary | ICD-10-CM

## 2018-11-13 HISTORY — PX: KNEE ARTHROSCOPY WITH MEDIAL MENISECTOMY: SHX5651

## 2018-11-13 SURGERY — ARTHROSCOPY, KNEE, WITH MEDIAL MENISCECTOMY
Anesthesia: General | Laterality: Left

## 2018-11-13 MED ORDER — CEFAZOLIN SODIUM-DEXTROSE 2-4 GM/100ML-% IV SOLN
INTRAVENOUS | Status: AC
  Filled 2018-11-13: qty 100

## 2018-11-13 MED ORDER — HYDROMORPHONE HCL 1 MG/ML IJ SOLN
INTRAMUSCULAR | Status: AC
  Filled 2018-11-13: qty 1

## 2018-11-13 MED ORDER — ONDANSETRON HCL 4 MG/2ML IJ SOLN
INTRAMUSCULAR | Status: AC
  Filled 2018-11-13: qty 2

## 2018-11-13 MED ORDER — BUPIVACAINE-EPINEPHRINE (PF) 0.5% -1:200000 IJ SOLN
INTRAMUSCULAR | Status: AC
  Filled 2018-11-13: qty 30

## 2018-11-13 MED ORDER — FENTANYL CITRATE (PF) 100 MCG/2ML IJ SOLN
INTRAMUSCULAR | Status: AC
  Filled 2018-11-13: qty 2

## 2018-11-13 MED ORDER — SODIUM CHLORIDE 0.9 % IR SOLN
Status: DC | PRN
  Administered 2018-11-13 (×3): 3000 mL

## 2018-11-13 MED ORDER — PROMETHAZINE HCL 12.5 MG PO TABS: 12.5000 mg | ORAL_TABLET | Freq: Four times a day (QID) | ORAL | 0 refills | Status: DC | PRN

## 2018-11-13 MED ORDER — PROPOFOL 10 MG/ML IV BOLUS
INTRAVENOUS | Status: DC | PRN
  Administered 2018-11-13: 150 mg via INTRAVENOUS

## 2018-11-13 MED ORDER — HYDROCODONE-ACETAMINOPHEN 7.5-325 MG PO TABS
1.0000 | ORAL_TABLET | Freq: Once | ORAL | Status: AC | PRN
  Administered 2018-11-13: 1 via ORAL
  Filled 2018-11-13: qty 1

## 2018-11-13 MED ORDER — PROMETHAZINE HCL 25 MG/ML IJ SOLN: 6.2500 mg | INTRAMUSCULAR | Status: DC | PRN

## 2018-11-13 MED ORDER — MIDAZOLAM HCL 2 MG/2ML IJ SOLN
INTRAMUSCULAR | Status: AC
  Filled 2018-11-13: qty 2

## 2018-11-13 MED ORDER — EPINEPHRINE PF 1 MG/ML IJ SOLN
INTRAMUSCULAR | Status: AC
  Filled 2018-11-13: qty 3

## 2018-11-13 MED ORDER — MIDAZOLAM HCL 5 MG/5ML IJ SOLN
INTRAMUSCULAR | Status: DC | PRN
  Administered 2018-11-13: 2 mg via INTRAVENOUS

## 2018-11-13 MED ORDER — LACTATED RINGERS IV SOLN
INTRAVENOUS | Status: DC
  Administered 2018-11-13: 1000 mL via INTRAVENOUS

## 2018-11-13 MED ORDER — SODIUM CHLORIDE 0.9 % IR SOLN
Status: DC | PRN
  Administered 2018-11-13: 1000 mL

## 2018-11-13 MED ORDER — EPHEDRINE SULFATE 50 MG/ML IJ SOLN
INTRAMUSCULAR | Status: DC | PRN
  Administered 2018-11-13 (×2): 10 mg via INTRAVENOUS

## 2018-11-13 MED ORDER — LACTATED RINGERS IV SOLN: INTRAVENOUS | Status: DC

## 2018-11-13 MED ORDER — MEPERIDINE HCL 50 MG/ML IJ SOLN: 6.2500 mg | INTRAMUSCULAR | Status: DC | PRN

## 2018-11-13 MED ORDER — OXYCODONE-ACETAMINOPHEN 10-325 MG PO TABS: 1.0000 | ORAL_TABLET | ORAL | 0 refills | Status: DC | PRN

## 2018-11-13 MED ORDER — FENTANYL CITRATE (PF) 100 MCG/2ML IJ SOLN
INTRAMUSCULAR | Status: DC | PRN
  Administered 2018-11-13: 100 ug via INTRAVENOUS
  Administered 2018-11-13: 50 ug via INTRAVENOUS
  Administered 2018-11-13 (×2): 25 ug via INTRAVENOUS

## 2018-11-13 MED ORDER — CEFAZOLIN SODIUM-DEXTROSE 2-4 GM/100ML-% IV SOLN
2.0000 g | INTRAVENOUS | Status: AC
  Administered 2018-11-13: 2 g via INTRAVENOUS

## 2018-11-13 MED ORDER — CHLORHEXIDINE GLUCONATE 4 % EX LIQD: 60.0000 mL | Freq: Once | CUTANEOUS | Status: DC

## 2018-11-13 MED ORDER — BUPIVACAINE-EPINEPHRINE (PF) 0.5% -1:200000 IJ SOLN
INTRAMUSCULAR | Status: DC | PRN
  Administered 2018-11-13 (×2): 30 mL

## 2018-11-13 MED ORDER — PROPOFOL 10 MG/ML IV BOLUS
INTRAVENOUS | Status: AC
  Filled 2018-11-13: qty 20

## 2018-11-13 MED ORDER — HYDROMORPHONE HCL 1 MG/ML IJ SOLN
0.2500 mg | INTRAMUSCULAR | Status: DC | PRN
  Administered 2018-11-13 (×5): 0.5 mg via INTRAVENOUS
  Filled 2018-11-13 (×3): qty 0.5

## 2018-11-13 SURGICAL SUPPLY — 47 items
APL PRP STRL LF DISP 70% ISPRP (MISCELLANEOUS) ×1
BANDAGE ELASTIC 6 LF NS (GAUZE/BANDAGES/DRESSINGS) ×2 IMPLANT
BLADE SURG SZ11 CARB STEEL (BLADE) ×2 IMPLANT
BNDG CMPR MED 5X6 ELC HKLP NS (GAUZE/BANDAGES/DRESSINGS) ×1
CHLORAPREP W/TINT 26 (MISCELLANEOUS) ×2 IMPLANT
CLOTH BEACON ORANGE TIMEOUT ST (SAFETY) ×2 IMPLANT
COOLER CRYO IC GRAV AND TUBE (ORTHOPEDIC SUPPLIES) ×2 IMPLANT
COVER WAND RF STERILE (DRAPES) ×2 IMPLANT
CUFF CRYO KNEE18X23 MED (MISCELLANEOUS) ×1 IMPLANT
CUFF TOURN SGL QUICK 34 (TOURNIQUET CUFF) ×2
CUFF TRNQT CYL 34X4.125X (TOURNIQUET CUFF) IMPLANT
DECANTER SPIKE VIAL GLASS SM (MISCELLANEOUS) ×4 IMPLANT
GAUZE 4X4 16PLY RFD (DISPOSABLE) ×2 IMPLANT
GAUZE SPONGE 4X4 12PLY STRL (GAUZE/BANDAGES/DRESSINGS) ×2 IMPLANT
GAUZE SPONGE 4X4 16PLY XRAY LF (GAUZE/BANDAGES/DRESSINGS) ×2 IMPLANT
GAUZE XEROFORM 5X9 LF (GAUZE/BANDAGES/DRESSINGS) ×2 IMPLANT
GLOVE BIOGEL PI IND STRL 7.0 (GLOVE) ×2 IMPLANT
GLOVE BIOGEL PI INDICATOR 7.0 (GLOVE) ×2
GLOVE ECLIPSE 6.5 STRL STRAW (GLOVE) ×1 IMPLANT
GLOVE SKINSENSE NS SZ8.0 LF (GLOVE) ×1
GLOVE SKINSENSE STRL SZ8.0 LF (GLOVE) ×1 IMPLANT
GLOVE SS N UNI LF 8.5 STRL (GLOVE) ×2 IMPLANT
GOWN STRL REUS W/ TWL LRG LVL3 (GOWN DISPOSABLE) ×1 IMPLANT
GOWN STRL REUS W/TWL LRG LVL3 (GOWN DISPOSABLE) ×2
GOWN STRL REUS W/TWL XL LVL3 (GOWN DISPOSABLE) ×2 IMPLANT
IV NS IRRIG 3000ML ARTHROMATIC (IV SOLUTION) ×5 IMPLANT
KIT BLADEGUARD II DBL (SET/KITS/TRAYS/PACK) ×2 IMPLANT
KIT TURNOVER CYSTO (KITS) ×2 IMPLANT
MANIFOLD NEPTUNE II (INSTRUMENTS) ×2 IMPLANT
MARKER SKIN DUAL TIP RULER LAB (MISCELLANEOUS) ×2 IMPLANT
NDL HYPO 18GX1.5 BLUNT FILL (NEEDLE) ×1 IMPLANT
NDL HYPO 21X1.5 SAFETY (NEEDLE) ×1 IMPLANT
NDL SPNL 18GX3.5 QUINCKE PK (NEEDLE) ×1 IMPLANT
NEEDLE HYPO 18GX1.5 BLUNT FILL (NEEDLE) ×2 IMPLANT
NEEDLE HYPO 21X1.5 SAFETY (NEEDLE) ×2 IMPLANT
NEEDLE SPNL 18GX3.5 QUINCKE PK (NEEDLE) ×2 IMPLANT
NS IRRIG 1000ML POUR BTL (IV SOLUTION) ×2 IMPLANT
PACK ARTHRO LIMB DRAPE STRL (MISCELLANEOUS) ×2 IMPLANT
PAD ABD 5X9 TENDERSORB (GAUZE/BANDAGES/DRESSINGS) ×2 IMPLANT
PAD ARMBOARD 7.5X6 YLW CONV (MISCELLANEOUS) ×2 IMPLANT
PADDING CAST COTTON 6X4 STRL (CAST SUPPLIES) ×2 IMPLANT
SET ARTHROSCOPY INST (INSTRUMENTS) ×2 IMPLANT
SET BASIN LINEN APH (SET/KITS/TRAYS/PACK) ×2 IMPLANT
SUT ETHILON 3 0 FSL (SUTURE) ×2 IMPLANT
SYR 10ML LL (SYRINGE) ×2 IMPLANT
SYR 30ML LL (SYRINGE) ×2 IMPLANT
TUBE CONNECTING 12X1/4 (SUCTIONS) ×4 IMPLANT

## 2018-11-13 NOTE — Brief Op Note (Signed)
11/13/2018  1:51 PM  PATIENT:  Samuel [name]  68 y.o. male  PRE-OPERATIVE DIAGNOSIS:  Left knee medial meniscus and lateral meniscus tears  POST-OPERATIVE DIAGNOSIS:  Left knee medial meniscus and lateral meniscus tears  FINDINGS:  MEDIAL torn medial meniscus grade 4 chondral changes throughout the articular surfaces PATELLOFEMEORAL Patellofemoral joint large grade 3 trochlear lesion LATERAL: Lateral anterior Sonnen tear posterior Bernet tear large fissure tibial plateau grade 4 chondral lesion lateral femoral condyle NOTCH  ACL and PCL intact  PROCEDURE:  Procedure(s): KNEE ARTHROSCOPY WITH PARTIAL MEDIAL AND LATERAL MENISCECTOMY (Left) - 29880  SURGEON:  Surgeon(s) and Role:    Carole Civil, MD - Primary  PHYSICIAN ASSISTANT:   ASSISTANTS: none   ANESTHESIA:   GENERAL  EBL:  none   BLOOD ADMINISTERED:none  DRAINS: none   LOCAL MEDICATIONS USED:  MARCAINE     SPECIMEN:  No Specimen  DISPOSITION OF SPECIMEN:  N/A  COUNTS:  YES  TOURNIQUET:   See anesthesia record   DICTATION: .Dragon Dictation  PLAN OF CARE: Discharge to home after PACU  PATIENT DISPOSITION:  PACU - hemodynamically stable.   Delay start of Pharmacological VTE agent (>24hrs) due to surgical blood loss or risk of bleeding: not applicable

## 2018-11-13 NOTE — Transfer of Care (Signed)
Immediate Anesthesia Transfer of Care Note  Patient: Samuel [name]  Procedure(s) Performed: KNEE ARTHROSCOPY WITH PARTIAL MEDIAL AND LATERAL MENISCECTOMY (Left )  Patient Location: PACU  Anesthesia Type:General  Level of Consciousness: awake and patient cooperative  Airway & Oxygen Therapy: Patient Spontanous Breathing and Patient connected to nasal cannula oxygen  Post-op Assessment: Report given to RN, Post -op Vital signs reviewed and stable and Patient moving all extremities  Post vital signs: Reviewed and stable  Last Vitals:  Vitals Value Taken Time  BP    Temp    Pulse 73 11/13/18 1356  Resp    SpO2 94 % 11/13/18 1356  Vitals shown include unvalidated device data.  Last Pain:  Vitals:   11/13/18 1119  TempSrc: Oral  PainSc: 6       Patients Stated Pain Goal: 5 (97/35/32 9924)  Complications: No apparent anesthesia complications

## 2018-11-13 NOTE — Discharge Instructions (Signed)
General Anesthesia, Adult, Care After  This sheet gives you information about how to care for yourself after your procedure. Your health care provider may also give you more specific instructions. If you have problems or questions, contact your health care provider.  What can I expect after the procedure?  After the procedure, the following side effects are common:  Pain or discomfort at the IV site.  Nausea.  Vomiting.  Sore throat.  Trouble concentrating.  Feeling cold or chills.  Weak or tired.  Sleepiness and fatigue.  Soreness and body aches. These side effects can affect parts of the body that were not involved in surgery.  Follow these instructions at home:    For at least 24 hours after the procedure:  Have a responsible adult stay with you. It is important to have someone help care for you until you are awake and alert.  Rest as needed.  Do not:  Participate in activities in which you could fall or become injured.  Drive.  Use heavy machinery.  Drink alcohol.  Take sleeping pills or medicines that cause drowsiness.  Make important decisions or sign legal documents.  Take care of children on your own.  Eating and drinking  Follow any instructions from your health care provider about eating or drinking restrictions.  When you feel hungry, start by eating small amounts of foods that are soft and easy to digest (bland), such as toast. Gradually return to your regular diet.  Drink enough fluid to keep your urine pale yellow.  If you vomit, rehydrate by drinking water, juice, or clear broth.  General instructions  If you have sleep apnea, surgery and certain medicines can increase your risk for breathing problems. Follow instructions from your health care provider about wearing your sleep device:  Anytime you are sleeping, including during daytime naps.  While taking prescription pain medicines, sleeping medicines, or medicines that make you drowsy.  Return to your normal activities as told by your health care  provider. Ask your health care provider what activities are safe for you.  Take over-the-counter and prescription medicines only as told by your health care provider.  If you smoke, do not smoke without supervision.  Keep all follow-up visits as told by your health care provider. This is important.  Contact a health care provider if:  You have nausea or vomiting that does not get better with medicine.  You cannot eat or drink without vomiting.  You have pain that does not get better with medicine.  You are unable to pass urine.  You develop a skin rash.  You have a fever.  You have redness around your IV site that gets worse.  Get help right away if:  You have difficulty breathing.  You have chest pain.  You have blood in your urine or stool, or you vomit blood.  Summary  After the procedure, it is common to have a sore throat or nausea. It is also common to feel tired.  Have a responsible adult stay with you for the first 24 hours after general anesthesia. It is important to have someone help care for you until you are awake and alert.  When you feel hungry, start by eating small amounts of foods that are soft and easy to digest (bland), such as toast. Gradually return to your regular diet.  Drink enough fluid to keep your urine pale yellow.  Return to your normal activities as told by your health care provider. Ask your health care   provider what activities are safe for you.  This information is not intended to replace advice given to you by your health care provider. Make sure you discuss any questions you have with your health care provider.  Document Released: 08/13/2000 Document Revised: 12/21/2016 Document Reviewed: 12/21/2016  Elsevier Interactive Patient Education  2019 Elsevier Inc.

## 2018-11-13 NOTE — Interval H&P Note (Signed)
History and Physical Interval Note:  11/13/2018 12:41 PM  Samuel [name]  has presented today for surgery, with the diagnosis of Left knee medial meniscus and lateral meniscus tears.  The various methods of treatment have been discussed with the patient and family. After consideration of risks, benefits and other options for treatment, the patient has consented to  Procedure(s): KNEE ARTHROSCOPY WITH MEDIAL MENISECETOMY AND LATERAL MENISCECTOMY (Left) as a surgical intervention.  The patient's history has been reviewed, patient examined, no change in status, stable for surgery.  I have reviewed the patient's chart and labs.  Questions were answered to the patient's satisfaction.     Arther Abbott

## 2018-11-13 NOTE — Anesthesia Postprocedure Evaluation (Signed)
Anesthesia Post Note  Patient: Samuel [name]  Procedure(s) Performed: KNEE ARTHROSCOPY WITH PARTIAL MEDIAL AND LATERAL MENISCECTOMY (Left )  Patient location during evaluation: PACU Anesthesia Type: General Level of consciousness: awake and patient cooperative Pain management: pain level controlled Vital Signs Assessment: post-procedure vital signs reviewed and stable Respiratory status: spontaneous breathing, respiratory function stable and nonlabored ventilation Cardiovascular status: blood pressure returned to baseline Postop Assessment: no apparent nausea or vomiting Anesthetic complications: no     Last Vitals:  Vitals:   11/13/18 1356 11/13/18 1358  BP: 133/82 133/82  Pulse: 73 70  Resp: 15 15  Temp: 36.6 C   SpO2: 94% 99%    Last Pain:  Vitals:   11/13/18 1404  TempSrc:   PainSc: 8                  Shawnie Nicole J

## 2018-11-13 NOTE — Anesthesia Preprocedure Evaluation (Signed)
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       Plan Discussed with: Anesthesiologist  Anesthesia Plan Comments:         Anesthesia Quick Evaluation

## 2018-11-13 NOTE — Anesthesia Procedure Notes (Signed)
Procedure Name: LMA Insertion Date/Time: 11/13/2018 12:51 PM Performed by: Charmaine Downs, CRNA Pre-anesthesia Checklist: Patient identified, Patient being monitored, Emergency Drugs available, Timeout performed and Suction available Patient Re-evaluated:Patient Re-evaluated prior to induction Oxygen Delivery Method: Circle System Utilized Preoxygenation: Pre-oxygenation with 100% oxygen Induction Type: IV induction Ventilation: Mask ventilation without difficulty LMA: LMA inserted LMA Size: 4.0 Number of attempts: 1 Placement Confirmation: positive ETCO2 and breath sounds checked- equal and bilateral Tube secured with: Tape Dental Injury: Teeth and Oropharynx as per pre-operative assessment

## 2018-11-13 NOTE — Op Note (Signed)
11/13/2018  1:51 PM  PATIENT:  Samuel [name]  68 y.o. male  PRE-OPERATIVE DIAGNOSIS:  Left knee medial meniscus and lateral meniscus tears  POST-OPERATIVE DIAGNOSIS:  Left knee medial meniscus and lateral meniscus tears  FINDINGS:  MEDIAL torn medial meniscus grade 4 chondral changes throughout the articular surfaces PATELLOFEMEORAL Patellofemoral joint large grade 3 trochlear lesion LATERAL: Lateral anterior Correnti tear posterior Adeyemi tear large fissure tibial plateau grade 4 chondral lesion lateral femoral condyle NOTCH  ACL and PCL intact  PROCEDURE:  Procedure(s): KNEE ARTHROSCOPY WITH PARTIAL MEDIAL AND LATERAL MENISCECTOMY (Left) - 29880  SURGEON:  Surgeon(s) and Role:    * Harrison, Stanley E, MD - Primary  PHYSICIAN ASSISTANT:   ASSISTANTS: none   ANESTHESIA:   GENERAL  EBL:  none   BLOOD ADMINISTERED:none  DRAINS: none   LOCAL MEDICATIONS USED:  MARCAINE     SPECIMEN:  No Specimen  DISPOSITION OF SPECIMEN:  N/A  COUNTS:  YES  TOURNIQUET:   See anesthesia record   DICTATION: .Dragon Dictation  PLAN OF CARE: Discharge to home after PACU  PATIENT DISPOSITION:  PACU - hemodynamically stable.   Delay start of Pharmacological VTE agent (>24hrs) due to surgical blood loss or risk of bleeding: not applicable  

## 2018-11-14 ENCOUNTER — Encounter (HOSPITAL_COMMUNITY): Payer: Self-pay | Admitting: Orthopedic Surgery

## 2018-11-18 DIAGNOSIS — Z9889 Other specified postprocedural states: Secondary | ICD-10-CM | POA: Insufficient documentation

## 2018-11-19 ENCOUNTER — Other Ambulatory Visit: Payer: Self-pay

## 2018-11-19 ENCOUNTER — Ambulatory Visit (INDEPENDENT_AMBULATORY_CARE_PROVIDER_SITE_OTHER): Payer: Medicare HMO | Admitting: Orthopedic Surgery

## 2018-11-19 ENCOUNTER — Encounter: Payer: Self-pay | Admitting: Orthopedic Surgery

## 2018-11-19 VITALS — Temp 98.2°F

## 2018-11-19 DIAGNOSIS — Z9889 Other specified postprocedural states: Secondary | ICD-10-CM

## 2018-11-19 DIAGNOSIS — G8918 Other acute postprocedural pain: Secondary | ICD-10-CM

## 2018-11-19 MED ORDER — OXYCODONE-ACETAMINOPHEN 10-325 MG PO TABS: 1.0000 | ORAL_TABLET | ORAL | 0 refills | Status: DC | PRN

## 2018-11-19 NOTE — Op Note (Signed)
11/13/2018  1:51 PM  PATIENT:  Samuel [name]  68 y.o. male  PRE-OPERATIVE DIAGNOSIS:  Left knee medial meniscus and lateral meniscus tears  POST-OPERATIVE DIAGNOSIS:  Left knee medial meniscus and lateral meniscus tears  FINDINGS:  MEDIAL torn medial meniscus grade 4 chondral changes throughout the articular surfaces PATELLOFEMEORAL Patellofemoral joint large grade 3 trochlear lesion LATERAL: Lateral anterior [place] tear posterior Tarazon tear large fissure tibial plateau grade 4 chondral lesion lateral femoral condyle NOTCH  ACL and PCL intact  PROCEDURE:  Procedure(s): KNEE ARTHROSCOPY WITH PARTIAL MEDIAL AND LATERAL MENISCECTOMY (Left) - 29880  SURGEON:  Surgeon(s) and Role:      Carole Civil, MD - Primary  Details of procedure  The patient was seen in preop left knee was marked for surgery after confirmation and chart review.  The patient was taken to surgery for general anesthesia.  After successful general anesthesia the knee was prepped and draped sterilely and timeout was completed  Diagnostic arthroscopy was performed through the lateral portal and the entire knee was evaluated.  Findings are noted above  We started with a torn medial meniscus and we performed a medial meniscectomy using a duckbill forceps and motorized shaver.  Arthroscopic confirmation of stable rim was performed with a probe  Working our way to the lateral compartment the torn anterior Evers was resected with a motorized shaver.  Synovectomy was done as needed to control and improve visualization  The chondral surfaces were left free from intervention  After thorough irrigation and suction of the knee the portals were closed with 3-0 nylon suture  Patient taken to recovery room in stable condition  PHYSICIAN ASSISTANT:   ASSISTANTS: none   ANESTHESIA:   GENERAL  EBL:  none   BLOOD ADMINISTERED:none  DRAINS: none   LOCAL MEDICATIONS USED:  MARCAINE     SPECIMEN:  No  Specimen  DISPOSITION OF SPECIMEN:  N/A  COUNTS:  YES  TOURNIQUET:   See anesthesia record   DICTATION: .Dragon Dictation  PLAN OF CARE: Discharge to home after PACU  PATIENT DISPOSITION:  PACU - hemodynamically stable.   Delay start of Pharmacological VTE agent (>24hrs) due to surgical blood loss or risk of bleeding: not applicable

## 2018-11-19 NOTE — Patient Instructions (Signed)
Ice 4-6 x a day  Home exercises

## 2018-11-19 NOTE — Progress Notes (Signed)
Patient ID: Samuel [name], male   DOB: Mar 09, 1951, 68 y.o.   MRN: [id number]  Chief Complaint  Patient presents with  . Routine Post Op    11/13/18 knee scope left     The patient is status post knee arthroscopy as described. They're doing well without any major complaints. Pain is not well controlled. Incisions are clean, we removed the sutures  The patient will start physical therapy at home   Follow-up 3 weeks  Operative report 11/13/2018   PRE-OPERATIVE DIAGNOSIS:  Left knee medial meniscus and lateral meniscus tears  POST-OPERATIVE DIAGNOSIS:  Left knee medial meniscus and lateral meniscus tears  FINDINGS:  MEDIAL torn medial meniscus grade 4 chondral changes throughout the articular surfaces PATELLOFEMEORAL Patellofemoral joint large grade 3 trochlear lesion LATERAL: Lateral anterior [place] tear posterior [place]baugh tear large fissure tibial plateau grade 4 chondral lesion lateral femoral condyle NOTCH  ACL and PCL intact  PROCEDURE:  Procedure(s): KNEE ARTHROSCOPY WITH PARTIAL MEDIAL AND LATERAL MENISCECTOMY (Left) - 62130  SURGEON:  Surgeon(s) and Role:    Carole Civil, MD - Primary    Meds ordered this encounter  Medications  . oxyCODONE-acetaminophen (PERCOCET) 10-325 MG tablet    Sig: Take 1 tablet by mouth every 4 (four) hours as needed for up to 7 days for pain.    Dispense:  42 tablet    Refill:  0   Encounter Diagnoses  Name Primary?  . S/P left knee arthroscopy 11/13/18    . Post-operative pain Yes   [name] suffers from chronic pain is been on oxycodone for several years we agreed to use 10 mg of Percocet for 2 weeks then 7.5 then 5 and then get him into a pain management center to handle his chronic pain which is related to some chronic back pain and chronic lower back pain which he has had for several years

## 2018-11-25 ENCOUNTER — Other Ambulatory Visit: Payer: Self-pay | Admitting: Radiology

## 2018-11-25 DIAGNOSIS — Z9889 Other specified postprocedural states: Secondary | ICD-10-CM

## 2018-11-25 DIAGNOSIS — G8918 Other acute postprocedural pain: Secondary | ICD-10-CM

## 2018-11-25 NOTE — Telephone Encounter (Signed)
Patient called, requests refill oxycodone.   [facility] in [place].

## 2018-11-26 ENCOUNTER — Other Ambulatory Visit: Payer: Self-pay | Admitting: Orthopedic Surgery

## 2018-11-26 DIAGNOSIS — G8918 Other acute postprocedural pain: Secondary | ICD-10-CM

## 2018-11-26 MED ORDER — OXYCODONE-ACETAMINOPHEN 5-325 MG PO TABS: 1.0000 | ORAL_TABLET | ORAL | 0 refills | Status: DC | PRN

## 2018-11-26 NOTE — Telephone Encounter (Signed)
IC and patient not home.  Spoke with male at residence who will advise him to call the office.  Need to advise him the Rx refill not approved.

## 2018-12-01 ENCOUNTER — Other Ambulatory Visit: Payer: Self-pay

## 2018-12-01 ENCOUNTER — Encounter: Payer: Self-pay | Admitting: Orthopedic Surgery

## 2018-12-01 ENCOUNTER — Ambulatory Visit (INDEPENDENT_AMBULATORY_CARE_PROVIDER_SITE_OTHER): Payer: Medicare HMO | Admitting: Orthopedic Surgery

## 2018-12-01 VITALS — Temp 98.6°F

## 2018-12-01 DIAGNOSIS — Z9889 Other specified postprocedural states: Secondary | ICD-10-CM

## 2018-12-01 DIAGNOSIS — G8929 Other chronic pain: Secondary | ICD-10-CM

## 2018-12-01 DIAGNOSIS — M545 Low back pain: Secondary | ICD-10-CM

## 2018-12-01 MED ORDER — PREDNISONE 10 MG (48) PO TBPK: ORAL_TABLET | Freq: Every day | ORAL | 0 refills | Status: DC

## 2018-12-03 ENCOUNTER — Other Ambulatory Visit: Payer: Self-pay | Admitting: Orthopedic Surgery

## 2018-12-03 DIAGNOSIS — G8918 Other acute postprocedural pain: Secondary | ICD-10-CM

## 2018-12-03 MED ORDER — OXYCODONE-ACETAMINOPHEN 5-325 MG PO TABS: 1.0000 | ORAL_TABLET | ORAL | 0 refills | Status: DC | PRN

## 2018-12-03 NOTE — Telephone Encounter (Signed)
Patient called and wanted to thank you for seeing him on Monday.  He says the Prednisone has helped a lot.  He does request something for pain though as he ran out of the Percocet last night.     He requests the Oxycodone/Acetaminophen 5-325 mgs.  Qty  42  Sig: Take 1 tablet by mouth every 4 (four) hours as needed for up to 7 days for severe pain.  He states he uses [facility]

## 2018-12-10 ENCOUNTER — Other Ambulatory Visit: Payer: Self-pay

## 2018-12-10 ENCOUNTER — Ambulatory Visit (INDEPENDENT_AMBULATORY_CARE_PROVIDER_SITE_OTHER): Payer: Medicare HMO | Admitting: Orthopedic Surgery

## 2018-12-10 VITALS — BP 119/81 | HR 63 | Temp 97.9°F | Ht 66.0 in | Wt 163.0 lb

## 2018-12-10 DIAGNOSIS — Z9889 Other specified postprocedural states: Secondary | ICD-10-CM

## 2018-12-10 DIAGNOSIS — G8929 Other chronic pain: Secondary | ICD-10-CM

## 2018-12-10 DIAGNOSIS — M545 Low back pain, unspecified: Secondary | ICD-10-CM

## 2018-12-10 DIAGNOSIS — G8918 Other acute postprocedural pain: Secondary | ICD-10-CM

## 2018-12-10 MED ORDER — PREDNISONE 10 MG (48) PO TBPK: ORAL_TABLET | Freq: Every day | ORAL | 0 refills | Status: DC

## 2018-12-10 MED ORDER — OXYCODONE-ACETAMINOPHEN 5-325 MG PO TABS: 1.0000 | ORAL_TABLET | ORAL | 0 refills | Status: DC | PRN

## 2018-12-10 NOTE — Progress Notes (Signed)
POST OP VISIT   Chief Complaint  Patient presents with  . Follow-up    Recheck on left knee, DOS 11-13-18.    Encounter Diagnoses  Name Primary?  . S/P left knee arthroscopy 11/13/18  Yes  . Chronic midline low back pain, unspecified whether sciatica present   . Post-operative pain      Current Outpatient Medications:  .  Cholecalciferol (VITAMIN D3) 10 MCG (400 UNIT) CAPS, Take 400 Units by mouth daily. , Disp: , Rfl:  .  gabapentin (NEURONTIN) 600 MG tablet, Take 600 mg by mouth 3 (three) times daily., Disp: , Rfl: 3 .  levothyroxine (SYNTHROID, LEVOTHROID) 200 MCG tablet, Take 400 mcg by mouth daily before breakfast. , Disp: , Rfl:  .  naproxen (NAPROSYN) 500 MG tablet, Take 500 mg by mouth 2 (two) times daily., Disp: , Rfl: 5 .  oxyCODONE (OXY IR/ROXICODONE) 5 MG immediate release tablet, , Disp: , Rfl:  .  oxyCODONE-acetaminophen (PERCOCET/ROXICET) 5-325 MG tablet, Take 1 tablet by mouth every 4 (four) hours as needed for up to 7 days for severe pain., Disp: 42 tablet, Rfl: 0 .  predniSONE (STERAPRED UNI-PAK 48 TAB) 10 MG (48) TBPK tablet, Take by mouth daily. 10 mg 12 days, Disp: 48 tablet, Rfl: 0 .  promethazine (PHENERGAN) 12.5 MG tablet, Take 1 tablet (12.5 mg total) by mouth every 6 (six) hours as needed for nausea or vomiting., Disp: 30 tablet, Rfl: 0 .  traZODone (DESYREL) 100 MG tablet, Take 100 mg by mouth at bedtime., Disp: , Rfl:   Status post dose of prednisone for postop knee pain lower back pain radicular leg pain  Prednisone helped everything except his knee still having medial and lateral knee pain  I reinjected his knee again  Procedure note left knee injection   verbal consent was obtained to inject left knee joint  Timeout was completed to confirm the site of injection  The medications used were 40 mg of Depo-Medrol and 1% lidocaine 3 cc  Anesthesia was provided by ethyl chloride and the skin was prepped with alcohol.  After cleaning the skin with  alcohol a 20-gauge needle was used to inject the left knee joint. There were no complications. A sterile bandage was applied.  Refilled his medicine including the prednisone  Follow-up in a week  Arrange pain management referral

## 2018-12-16 ENCOUNTER — Telehealth: Payer: Self-pay | Admitting: Radiology

## 2018-12-16 NOTE — Telephone Encounter (Signed)
Pain management has not been able to reach patient, I called him and gave him the phone number so he can call them.

## 2018-12-17 ENCOUNTER — Ambulatory Visit (INDEPENDENT_AMBULATORY_CARE_PROVIDER_SITE_OTHER): Payer: Medicare HMO | Admitting: Orthopedic Surgery

## 2018-12-17 ENCOUNTER — Telehealth: Payer: Self-pay | Admitting: Radiology

## 2018-12-17 ENCOUNTER — Other Ambulatory Visit: Payer: Self-pay

## 2018-12-17 VITALS — BP 122/76 | HR 78 | Temp 96.0°F | Ht 66.0 in | Wt 164.0 lb

## 2018-12-17 DIAGNOSIS — Z9889 Other specified postprocedural states: Secondary | ICD-10-CM

## 2018-12-17 DIAGNOSIS — G8918 Other acute postprocedural pain: Secondary | ICD-10-CM

## 2018-12-17 DIAGNOSIS — M5410 Radiculopathy, site unspecified: Secondary | ICD-10-CM

## 2018-12-17 MED ORDER — OXYCODONE-ACETAMINOPHEN 5-325 MG PO TABS: 1.0000 | ORAL_TABLET | ORAL | 0 refills | Status: AC | PRN

## 2018-12-17 NOTE — Telephone Encounter (Signed)
Received Auth from his insurance [employer] I have called to schedule. Thursday Aug 6th 11 am, arrive 1030 Dr Aline Brochure will call report to patient.   I have called patient to advise, he has voiced understanding.

## 2018-12-17 NOTE — Progress Notes (Signed)
Chief Complaint  Patient presents with  . Follow-up    Recheck on left knee, DOS 11-13-18.    Patient presents back after knee arthroscopy left knee about a month ago continues to have posterior radiating knee pain which he had prior to surgery.  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 Arthritis   . COPD (chronic obstructive pulmonary disease) ([place])   . Hypothyroidism     Review of Systems  Constitutional: Negative for chills, fever, malaise/fatigue and weight loss.  Gastrointestinal: Negative for constipation.       Denies loss bowel control   Genitourinary:       Denies urinary retention or los of bladder control     Physical Exam Vitals signs and nursing note reviewed.  Constitutional:      Appearance: Normal appearance.  Musculoskeletal:     Comments: Samuel [name] comes in ambulating with a cane limping left leg  Arthroscopy portals healed normally.  Small joint effusion with some loss of flexion extension but overall range of motion greater than 120 degrees.    Lower back tenderness left-sided lower gluteal tenderness  + SLR at 40 degrees on the left negative on the right  No loss of motor function or weakness  L5 dermatome sensory loss    Neurological:     Mental Status: He is alert and oriented to person, place, and time.     Deep Tendon  Reflexes:     Reflex Scores:      Patellar reflexes are 2+ on the right side and 2+ on the left side.      Achilles reflexes are 1+ on the right side and 1+ on the left side. Psychiatric:        Mood and Affect: Mood normal.     Encounter Diagnoses  Name Primary?  . S/P left knee arthroscopy 11/13/18    . Post-operative pain   . Radicular pain of left lower extremity Yes

## 2018-12-17 NOTE — Patient Instructions (Signed)
You have been scheduled for an MRI scan We will call your insurance company to do a precertification to get the MRI covered You will receive a phone call regarding the date of the scan 

## 2018-12-25 ENCOUNTER — Encounter (HOSPITAL_COMMUNITY): Payer: Self-pay

## 2018-12-25 ENCOUNTER — Other Ambulatory Visit: Payer: Self-pay

## 2018-12-25 ENCOUNTER — Ambulatory Visit (HOSPITAL_COMMUNITY)
Admission: RE | Admit: 2018-12-25 | Discharge: 2018-12-25 | Disposition: A | Discharge status: Home / Self Care | Payer: Medicare HMO | Source: Ambulatory Visit | Attending: Orthopedic Surgery | Admitting: Orthopedic Surgery

## 2018-12-25 DIAGNOSIS — M5410 Radiculopathy, site unspecified: Secondary | ICD-10-CM

## 2018-12-30 ENCOUNTER — Ambulatory Visit (HOSPITAL_COMMUNITY)
Admission: RE | Admit: 2018-12-30 | Discharge: 2018-12-30 | Disposition: A | Discharge status: Home / Self Care | Payer: Medicare HMO | Source: Ambulatory Visit | Attending: Orthopedic Surgery | Admitting: Orthopedic Surgery

## 2018-12-30 ENCOUNTER — Other Ambulatory Visit: Payer: Self-pay

## 2018-12-30 DIAGNOSIS — M5410 Radiculopathy, site unspecified: Secondary | ICD-10-CM | POA: Diagnosis not present

## 2019-01-01 ENCOUNTER — Telehealth: Payer: Self-pay | Admitting: Orthopedic Surgery

## 2019-01-01 DIAGNOSIS — M5410 Radiculopathy, site unspecified: Secondary | ICD-10-CM

## 2019-01-01 DIAGNOSIS — G8929 Other chronic pain: Secondary | ICD-10-CM

## 2019-01-01 MED ORDER — OXYCODONE-ACETAMINOPHEN 10-325 MG PO TABS: 1.0000 | ORAL_TABLET | Freq: Four times a day (QID) | ORAL | 0 refills | Status: AC | PRN

## 2019-01-01 NOTE — Telephone Encounter (Signed)
Will you let him know he will have to call [facility] and get records to take to [facility]?  I will send referral, but HE HAS to get the records   Dr Aline Brochure called him about MRI and we will be sending to [facility] when I am back in the office, in [place] now. Off next week

## 2019-01-01 NOTE — Telephone Encounter (Signed)
L3-L4: Mild disc bulging with superimposed left foraminal disc protrusion likely affecting the exiting left L3 nerve root. Mild bilateral facet arthropathy. Mild bilateral neuroforaminal stenosis. No spinal canal stenosis.   L4-L5:  Prior PLIF.  No residual stenosis.   L5-S1:  Prior PLIF.  No residual stenosis.   IMPRESSION: 1. Prior L4-S1 PLIF. 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     Electronically Signed   By: Titus Dubin M.D.   On: 12/31/2018 11:34  Ran out of meds, cant get grossman shes out of town

## 2019-01-01 NOTE — Telephone Encounter (Signed)
Samuel [name] called and stated that he has been to the [facility] clinic one time but they want to do blocks on him and states he has already been through that.  He said he wants to change pain clinics.  He wants [place] to refer him to [facility].  He said also that he has an MRI on 12/30/18 and that Dr. Aline Brochure was to call him with the results

## 2019-01-02 NOTE — Telephone Encounter (Signed)
I  Spoke to Mr. Samuel [name] and he understands.

## 2019-01-06 ENCOUNTER — Telehealth: Payer: Self-pay | Admitting: Orthopedic Surgery

## 2019-01-06 NOTE — Telephone Encounter (Signed)
Patient called back; voiced understanding of Dr's response.

## 2019-01-06 NOTE — Telephone Encounter (Signed)
Patient called for refill / aware Dr Aline Brochure is out of clinic this week - routing to Dr Luna Glasgow: oxyCODONE-acetaminophen (PERCOCET) 10-325 MG tablet 28 tablet  -[place]

## 2019-01-06 NOTE — Telephone Encounter (Signed)
No narcotics.  He has gotten narcotics from three different doctors in the last two weeks, a large number.

## 2019-01-12 ENCOUNTER — Telehealth: Payer: Self-pay

## 2019-01-12 NOTE — Telephone Encounter (Addendum)
Great, I am glad they will see him, but he needs to get records from [facility] pain clinic sent over. I sent our referral, but he has been in pain management already, and they must have those records. I am only allowed to send our records.   I have left message for him to call me back so we can advise.

## 2019-01-12 NOTE — Telephone Encounter (Signed)
Samuel [name] called stating that he talked with his insurance and they don't cover [name]. He is asking if we would refer him to [facility] in [place]. Stated that he has already spoken to them and was told they would need the referral from our office and they would be glad to see him.  Please call and advise

## 2019-01-12 NOTE — Telephone Encounter (Signed)
Patient called back, said he missed call. I relayed; patietn voiced understanding.

## 2019-01-15 ENCOUNTER — Other Ambulatory Visit: Payer: Self-pay | Admitting: Neurological Surgery

## 2019-01-15 DIAGNOSIS — M5416 Radiculopathy, lumbar region: Secondary | ICD-10-CM

## 2019-01-20 ENCOUNTER — Ambulatory Visit
Admission: RE | Admit: 2019-01-20 | Discharge: 2019-01-20 | Disposition: A | Discharge status: Home / Self Care | Payer: Medicare HMO | Source: Ambulatory Visit | Attending: Neurological Surgery | Admitting: Neurological Surgery

## 2019-01-20 ENCOUNTER — Other Ambulatory Visit: Payer: Self-pay

## 2019-01-20 DIAGNOSIS — M5416 Radiculopathy, lumbar region: Secondary | ICD-10-CM

## 2019-01-20 MED ORDER — IOPAMIDOL (ISOVUE-M 200) INJECTION 41%
1.0000 mL | Freq: Once | INTRAMUSCULAR | Status: AC
  Administered 2019-01-20: 1 mL via EPIDURAL

## 2019-01-20 MED ORDER — METHYLPREDNISOLONE ACETATE 40 MG/ML INJ SUSP (RADIOLOG
120.0000 mg | Freq: Once | INTRAMUSCULAR | Status: AC
  Administered 2019-01-20: 120 mg via EPIDURAL

## 2019-01-20 NOTE — Discharge Instructions (Signed)

## 2019-02-25 ENCOUNTER — Telehealth: Payer: Self-pay | Admitting: Orthopedic Surgery

## 2019-02-25 NOTE — Telephone Encounter (Signed)
(  02/24/19, 9:13am) Patient had called to inquire about setting up appointment with Dr Aline Brochure; said that his pain management provider advised it. Relayed to patient we do not yet have a report indicating this information. [place] office then discussed with clinic staff - relayed okay to schedule for knee, due to left knee surgery on 11/13/18. We attempted to call patient back on 02/24/19, approximated 4:15pm - unable to reach. 02/25/19, spoke with patient; scheduled appointment accordingly; patient aware, for knee only.

## 2019-03-06 ENCOUNTER — Other Ambulatory Visit: Payer: Self-pay

## 2019-03-06 ENCOUNTER — Encounter: Payer: Self-pay | Admitting: Orthopedic Surgery

## 2019-03-06 ENCOUNTER — Ambulatory Visit: Payer: Medicare HMO | Admitting: Orthopedic Surgery

## 2019-03-06 VITALS — BP 128/86 | HR 88 | Temp 97.3°F | Ht 65.0 in | Wt 176.0 lb

## 2019-03-06 DIAGNOSIS — M25511 Pain in right shoulder: Secondary | ICD-10-CM

## 2019-03-06 DIAGNOSIS — Z9889 Other specified postprocedural states: Secondary | ICD-10-CM

## 2019-03-06 NOTE — Progress Notes (Signed)
Chief Complaint  Patient presents with  . Follow-up    Recheck on left knee, DOS 11-13-18.    History 68-year-old male has had some improvement in his left knee got an injection in his lower back for leg pain that seems somewhat improved though still quite symptomatic  He has a new complaint today of pain in his right shoulder for 2 weeks no trauma he notices that he cannot move it as well as he did he did try some moist heat without any improvement  Review of systems is negative for any other musculoskeletal complaints  Physical Exam BP 128/86   Pulse 88   Temp (!) 97.3 F (36.3 C)   Ht 5\' 5"  (1.651 m)   Wt 176 lb (79.8 kg)   BMI 29.29 kg/m   Right shoulder  Skin is normal There is mild tenderness around the shoulder joint His active range of motion is less than 90 degrees abduction flexion is passive range of motion is 100 degrees of flexion with pain throughout the range of motion Right shoulder remained stable  Muscle tone is normal  Encounter Diagnoses  Name Primary?  . S/P left knee arthroscopy 11/13/18  Yes  . S/P right knee arthroscopy 06/05/2018   . Acute pain of right shoulder    Recommend injection subacromial space right shoulder if Codman exercises   Procedure note the subacromial injection shoulder RIGHT    Verbal consent was obtained to inject the  RIGHT   Shoulder  Timeout was completed to confirm the injection site is a subacromial space of the  RIGHT  shoulder   Medication used Depo-Medrol 40 mg and lidocaine 1% 3 cc  Anesthesia was provided by ethyl chloride  The injection was performed in the RIGHT  posterior subacromial space. After pinning the skin with alcohol and anesthetized the skin with ethyl chloride the subacromial space was injected using a 20-gauge needle. There were no complications  Sterile dressing was applied.

## 2019-03-25 ENCOUNTER — Telehealth: Payer: Self-pay | Admitting: Orthopedic Surgery

## 2019-03-25 ENCOUNTER — Ambulatory Visit: Payer: Medicare HMO

## 2019-03-25 ENCOUNTER — Other Ambulatory Visit: Payer: Self-pay

## 2019-03-25 ENCOUNTER — Ambulatory Visit: Payer: Medicare HMO | Admitting: Orthopedic Surgery

## 2019-03-25 VITALS — BP 142/83 | HR 86 | Temp 96.9°F | Ht 65.0 in | Wt 175.0 lb

## 2019-03-25 DIAGNOSIS — M25511 Pain in right shoulder: Secondary | ICD-10-CM | POA: Diagnosis not present

## 2019-03-25 DIAGNOSIS — G8929 Other chronic pain: Secondary | ICD-10-CM | POA: Diagnosis not present

## 2019-03-25 MED ORDER — OXYCODONE HCL 10 MG PO TABS: 10.0000 mg | ORAL_TABLET | Freq: Three times a day (TID) | ORAL | 0 refills | Status: AC

## 2019-03-25 NOTE — Patient Instructions (Signed)
You have been scheduled for an MRI scan We will call your insurance company to do a precertification to get the MRI covered You will receive a phone call regarding the date of the scan 

## 2019-03-25 NOTE — Progress Notes (Signed)
Progress Note   Patient ID: Samuel [name], male   DOB: Mar 19, 1951, 68 y.o.   MRN: [id number]   Chief Complaint  Patient presents with  . Shoulder Pain    Recheck on right shoulder.    Encounter Diagnosis  Name Primary?  . Right shoulder pain, unspecified chronicity Yes    68 year old male status post cervical fusion recently saw his neurosurgeon and was having some neck pain.  The neurosurgeon thought it was his shoulder.  I gave him a subacromial injection last time it lasted for 2 days  He complains of severe pain in his right shoulder having breakthrough pain even though he is on chronic pain management with oxycodone 10 mg every 8 hours.  He denies any trauma he says that 2 weeks ago he started having pain in the shoulder it got worse to the point where it is hard to get dressed hard to move his arm hard to pick it up over his head.  He does complain of pain starting in the cervical spine radiates to his scapula supraclavicular fossa and then down to his arm up to the level of the elbow    Review of Systems  Constitutional: Negative for chills and fever.  Musculoskeletal: Positive for joint pain and neck pain.  Neurological:       Denies radicular symptoms into the hand      BP (!) 142/83   Pulse 86   Temp (!) 96.9 F (36.1 C)   Ht 5\' 5"  (1.651 m)   Wt 175 lb (79.4 kg)   BMI 29.12 kg/m   Physical Exam  Samuel [name] holds his arm at his side very gingerly.  He had trouble taking off his shirt he would lift his arm away from his body.  However when he is in the adductor position he can externally rotate the arm passively and actively without any restriction or pain.  He is tender over the cervical spine midline upper PCS musculature periscapular musculature and then there is no tenderness down into the deltoid and lower part of his humerus. Sulcus test was normal Internal and external rotation strength was normal Again however we could not do a drop test or abduction test  because his arm is too painful to move into that position Skin is warm dry and intact no erythema no joint effusion Normal sensation in the right arm good pulse    Medical decisions:  (Established problem worse, x-ray ,physical therapy, over-the-counter medicines, read outside film or summarize x-ray)  Data  Imaging:   Imaging study shows no bony abnormality  Encounter Diagnosis  Name Primary?  . Right shoulder pain, unspecified chronicity Yes    PLAN:   MRI RT SHOULDER   Meds ordered this encounter  Medications  . Oxycodone HCl 10 MG TABS    Sig: Take 1 tablet (10 mg total) by mouth 3 (three) times daily after meals for 14 days.    Dispense:  42 tablet    Refill:  0       Arther Abbott, MD 03/25/2019 3:07 PM

## 2019-03-25 NOTE — Telephone Encounter (Signed)
Left msg on vm for patient return call to [facility]iology [facility].

## 2019-04-01 ENCOUNTER — Other Ambulatory Visit: Payer: Self-pay

## 2019-04-01 ENCOUNTER — Ambulatory Visit: Payer: Medicare HMO

## 2019-04-01 ENCOUNTER — Ambulatory Visit (HOSPITAL_COMMUNITY)
Admission: RE | Admit: 2019-04-01 | Discharge: 2019-04-01 | Disposition: A | Discharge status: Home / Self Care | Payer: Medicare HMO | Source: Ambulatory Visit | Attending: Orthopedic Surgery | Admitting: Orthopedic Surgery

## 2019-04-01 DIAGNOSIS — M25511 Pain in right shoulder: Secondary | ICD-10-CM | POA: Insufficient documentation

## 2019-04-01 DIAGNOSIS — G8929 Other chronic pain: Secondary | ICD-10-CM | POA: Diagnosis present

## 2019-04-03 ENCOUNTER — Telehealth: Payer: Self-pay | Admitting: Orthopedic Surgery

## 2019-04-03 NOTE — Telephone Encounter (Signed)
Byard Carranza had an MRI of his shoulder he was having severe shoulder pain and is neurosurgeon thought it might be cuff related however his MRI does not show any significant cuff pathology and I have advised him to call the doctor back to be seen again for the pain he is having.  CLINICAL DATA:  Right shoulder pain for 1-2 months. No specific injury.   EXAM: MRI OF THE RIGHT SHOULDER WITHOUT CONTRAST   TECHNIQUE: Multiplanar, multisequence MR imaging of the shoulder was performed. No intravenous contrast was administered.   COMPARISON:  Shoulder radiographs 03/25/2019   FINDINGS: Rotator cuff: No significant tendinopathy, partial or full-thickness tear.   Muscles:  Normal   Biceps long head:  Intact   Acromioclavicular Joint: Mild/moderate degenerative changes. Type 2 acromion. Minimal lateral downsloping but no definite undersurface spurring.   Glenohumeral Joint: Mild degenerative changes. Small joint effusion and mild synovitis versus adhesive capsulitis.   Labrum: Degenerative fraying type changes involving the anterior labrum without discrete tear.   Bones:  No acute bony findings.   Other: No subacromial/subdeltoid fluid collections to suggest bursitis.   IMPRESSION: 1. Intact rotator cuff tendons.  No significant tendinopathy. 2. No significant findings for bony impingement. 3. Intact long head biceps tendon and glenoid labrum. Anterior labral degenerative changes. 4. Mild glenohumeral joint degenerative changes with small joint effusion and mild synovitis versus adhesive capsulitis.     Electronically Signed   By: Marijo Sanes M.D.   On: 04/01/2019 14:37

## 2019-04-13 ENCOUNTER — Ambulatory Visit (INDEPENDENT_AMBULATORY_CARE_PROVIDER_SITE_OTHER): Payer: Medicare HMO | Admitting: Orthopedic Surgery

## 2019-04-13 ENCOUNTER — Other Ambulatory Visit: Payer: Self-pay

## 2019-04-13 VITALS — BP 148/84 | HR 78 | Temp 98.2°F | Ht 65.0 in | Wt 176.2 lb

## 2019-04-13 DIAGNOSIS — M25511 Pain in right shoulder: Secondary | ICD-10-CM

## 2019-04-13 NOTE — Progress Notes (Signed)
Follow-up status post MRI right shoulder patient's MRI did not show any evidence of rotator cuff tear  I have referred him back to Dr. Venetia Constable and Jeanella Anton is handling his oxycodone 10 mg 3 times a day    CLINICAL DATA:  Right shoulder pain for 1-2 months. No specific injury.   EXAM: MRI OF THE RIGHT SHOULDER WITHOUT CONTRAST   TECHNIQUE: Multiplanar, multisequence MR imaging of the shoulder was performed. No intravenous contrast was administered.   COMPARISON:  Shoulder radiographs 03/25/2019   FINDINGS: Rotator cuff: No significant tendinopathy, partial or full-thickness tear.   Muscles:  Normal   Biceps long head:  Intact   Acromioclavicular Joint: Mild/moderate degenerative changes. Type 2 acromion. Minimal lateral downsloping but no definite undersurface spurring.   Glenohumeral Joint: Mild degenerative changes. Small joint effusion and mild synovitis versus adhesive capsulitis.   Labrum: Degenerative fraying type changes involving the anterior labrum without discrete tear.   Bones:  No acute bony findings.   Other: No subacromial/subdeltoid fluid collections to suggest bursitis.   IMPRESSION: 1. Intact rotator cuff tendons.  No significant tendinopathy. 2. No significant findings for bony impingement. 3. Intact long head biceps tendon and glenoid labrum. Anterior labral degenerative changes. 4. Mild glenohumeral joint degenerative changes with small joint effusion and mild synovitis versus adhesive capsulitis.     Electronically Signed   By: Marijo Sanes M.D.   On: 04/01/2019 14:37

## 2019-05-20 ENCOUNTER — Other Ambulatory Visit (HOSPITAL_COMMUNITY): Payer: Self-pay | Admitting: Neurological Surgery

## 2019-05-20 ENCOUNTER — Other Ambulatory Visit: Payer: Self-pay | Admitting: Neurological Surgery

## 2019-05-20 DIAGNOSIS — M5412 Radiculopathy, cervical region: Secondary | ICD-10-CM

## 2019-05-29 ENCOUNTER — Other Ambulatory Visit: Payer: Self-pay

## 2019-05-29 ENCOUNTER — Ambulatory Visit (HOSPITAL_COMMUNITY)
Admission: RE | Admit: 2019-05-29 | Discharge: 2019-05-29 | Disposition: A | Discharge status: Home / Self Care | Payer: Medicare HMO | Source: Ambulatory Visit | Attending: Neurological Surgery | Admitting: Neurological Surgery

## 2019-05-29 DIAGNOSIS — M5412 Radiculopathy, cervical region: Secondary | ICD-10-CM | POA: Insufficient documentation

## 2019-06-12 ENCOUNTER — Other Ambulatory Visit: Payer: Self-pay | Admitting: Neurological Surgery

## 2019-06-15 NOTE — Pre-Procedure Instructions (Signed)
Samuel [name]  06/15/2019      [facility] - [place], [place] - [address] Jerolyn Shin [place] [address] Phone: [phone] Fax: [phone]    Your procedure is scheduled on Jan. 27  Report to [facility] Entrance A at 10:30 A.M.  Call this number if you have problems the morning of surgery:  [phone]   Remember:  Do not eat or drink after midnigh    Take these medicines the morning of surgery with A SIP OF WATER :              Albuterol nebulizer if needed             Baclofen (lioresal)             Duloxetine (cymbalta)             Gabapentin (neurontin)             Levothyroxine (synthroid)             Oxycodone             xtampza            7 days prior to surgery STOP taking any Aspirin (unless otherwise instructed by your surgeon), Aleve, Naproxen, Ibuprofen, Motrin, Advil, Goody's, BC's, all herbal medications, fish oil, and all vitamins, voltaren gel ( diclofenac sodium)    Do not wear jewelry.  Do not wear lotions, powders, or perfumes, or deodorant.  Do not shave 48 hours prior to surgery.  Men may shave face and neck.  Do not bring valuables to the hospital.  [facility] is not responsible for any belongings or valuables.  Contacts, dentures or bridgework may not be worn into surgery.  Leave your suitcase in the car.  After surgery it may be brought to your room.  For patients admitted to the hospital, discharge time will be determined by your treatment team.  Patients discharged the day of surgery will not be allowed to drive home.    Special instructions:   [place]- Preparing For Surgery  Before surgery, you can play an important role. Because skin is not sterile, your skin needs to be as free of germs as possible. You can reduce the number of germs on your skin by washing with CHG (chlorahexidine gluconate) Soap before surgery.  CHG is an antiseptic cleaner which kills germs and bonds with the skin to continue killing germs  even after washing.    Oral Hygiene is also important to reduce your risk of infection.  Remember - BRUSH YOUR TEETH THE MORNING OF SURGERY WITH YOUR REGULAR TOOTHPASTE  Please do not use if you have an allergy to CHG or antibacterial soaps. If your skin becomes reddened/irritated stop using the CHG.  Do not shave (including legs and underarms) for at least 48 hours prior to first CHG shower. It is OK to shave your face.  Please follow these instructions carefully.   1. Shower the NIGHT BEFORE SURGERY and the MORNING OF SURGERY with CHG.   2. If you chose to wash your hair, wash your hair first as usual with your normal shampoo.  3. After you shampoo, rinse your hair and body thoroughly to remove the shampoo.  4. Use CHG as you would any other liquid soap. You can apply CHG directly to the skin and wash gently with a scrungie or a clean washcloth.   5. Apply the CHG Soap to  your body ONLY FROM THE NECK DOWN.  Do not use on open wounds or open sores. Avoid contact with your eyes, ears, mouth and genitals (private parts). Wash Face and genitals (private parts)  with your normal soap.  6. Wash thoroughly, paying special attention to the area where your surgery will be performed.  7. Thoroughly rinse your body with warm water from the neck down.  8. DO NOT shower/wash with your normal soap after using and rinsing off the CHG Soap.  9. Pat yourself dry with a CLEAN TOWEL.  10. Wear CLEAN PAJAMAS to bed the night before surgery, wear comfortable clothes the morning of surgery  11. Place CLEAN SHEETS on your bed the night of your first shower and DO NOT SLEEP WITH PETS.    Day of Surgery:  Do not apply any deodorants/lotions.  Please wear clean clothes to the hospital/surgery center.   Remember to brush your teeth WITH YOUR REGULAR TOOTHPASTE.    Please read over the following fact sheets that you were given. Coughing and Deep Breathing and Surgical Site Infection  Prevention

## 2019-06-16 ENCOUNTER — Encounter (HOSPITAL_COMMUNITY): Payer: Self-pay

## 2019-06-16 ENCOUNTER — Other Ambulatory Visit: Payer: Self-pay

## 2019-06-16 ENCOUNTER — Other Ambulatory Visit (HOSPITAL_COMMUNITY)
Admission: RE | Admit: 2019-06-16 | Discharge: 2019-06-16 | Disposition: A | Discharge status: Home / Self Care | Payer: Medicare HMO | Source: Ambulatory Visit | Attending: Neurological Surgery | Admitting: Neurological Surgery

## 2019-06-16 ENCOUNTER — Encounter (HOSPITAL_COMMUNITY)
Admission: RE | Admit: 2019-06-16 | Discharge: 2019-06-16 | Disposition: A | Discharge status: Home / Self Care | Payer: Medicare HMO | Source: Ambulatory Visit | Attending: Neurological Surgery | Admitting: Neurological Surgery

## 2019-06-16 DIAGNOSIS — Z20822 Contact with and (suspected) exposure to covid-19: Secondary | ICD-10-CM | POA: Insufficient documentation

## 2019-06-16 DIAGNOSIS — Z01812 Encounter for preprocedural laboratory examination: Secondary | ICD-10-CM | POA: Diagnosis not present

## 2019-06-16 HISTORY — DX: Inflammatory liver disease, unspecified: K75.9

## 2019-06-16 LAB — CBC
HCT: 47.1 % (ref 39.0–52.0)
Hemoglobin: 14.4 g/dL (ref 13.0–17.0)
MCH: 30.3 pg (ref 26.0–34.0)
MCHC: 30.6 g/dL (ref 30.0–36.0)
MCV: 99.2 fL (ref 80.0–100.0)
Platelets: 248 10*3/uL (ref 150–400)
RBC: 4.75 MIL/uL (ref 4.22–5.81)
RDW: 15.4 % (ref 11.5–15.5)
WBC: 5.9 10*3/uL (ref 4.0–10.5)
nRBC: 0 % (ref 0.0–0.2)

## 2019-06-16 LAB — SURGICAL PCR SCREEN
MRSA, PCR: NEGATIVE
Staphylococcus aureus: POSITIVE — AB

## 2019-06-16 LAB — SARS CORONAVIRUS 2 (TAT 6-24 HRS): SARS Coronavirus 2: NEGATIVE

## 2019-06-16 NOTE — Anesthesia Preprocedure Evaluation (Addendum)
Anesthesia Evaluation  Patient identified by MRN, date of birth, ID band Patient awake    Reviewed: Allergy & Precautions, NPO status , Patient's Chart, lab work & pertinent test results  Airway Mallampati: II  TM Distance: >3 FB Neck ROM: Full    Dental  (+) Edentulous Upper, Edentulous Lower   Pulmonary  06/16/2019 SARS coronavirus neg   breath sounds clear to auscultation       Cardiovascular  Rhythm:Regular Rate:Normal     Neuro/Psych    GI/Hepatic   Endo/Other  Hypothyroidism   Renal/GU      Musculoskeletal  (+) Arthritis ,   Abdominal   Peds  Hematology   Anesthesia Other Findings   Reproductive/Obstetrics                            Anesthesia Physical Anesthesia Plan  ASA: II  Anesthesia Plan: General   Post-op Pain Management:    Induction: Intravenous  PONV Risk Score and Plan: 2 and Ondansetron and Dexamethasone  Airway Management Planned: Oral ETT  Additional Equipment:   Intra-op Plan:   Post-operative Plan: Extubation in OR  Informed Consent: I have reviewed the patients History and Physical, chart, labs and discussed the procedure including the risks, benefits and alternatives for the proposed anesthesia with the patient or authorized representative who has indicated his/her understanding and acceptance.     Dental advisory given  Plan Discussed with: CRNA and Anesthesiologist  Anesthesia Plan Comments: (Due to history of ACDF and thyroidectomy, pt was seen by Dr. Annalee Genta for preop ENT consult. Per note 06/12/19, "The patient presents for evaluation of vocal cord function. He is scheduled to undergo revision anterior cervical fusion with possible posterior fusion performed by Dr. Maurice Small within the next several weeks. Flexible laryngoscopy performed today shows normal bilateral vocal cord mobility, no evidence of vocal cord dysfunction or weakness. Patient  reassured regarding these findings, he will proceed with his cervical disc surgery as scheduled.")      Anesthesia Quick Evaluation

## 2019-06-16 NOTE — Progress Notes (Signed)
PCP - Dr. Salli Real Cardiologist - denies  PPM/ICD - denies  Chest x-ray - N/A EKG - N/A Stress Test - per patient, "about 5 or more years ago, couldn't remember but it was normal" ECHO - denies Cardiac Cath - denies  Sleep Study - denies CPAP - N/A  Blood Thinner Instructions: N/A Aspirin Instructions: N/A  ERAS Protcol - No PRE-SURGERY Ensure or G2- N/A  COVID TEST- Scheduled for today after PAT appointment. Patient verbalized understanding of self-quarantine instructions, appointment time and place.  Anesthesia review: No  Patient denies shortness of breath, fever, cough and chest pain at PAT appointment  All instructions explained to the patient, with a verbal understanding of the material. Patient agrees to go over the instructions while at home for a better understanding. Patient also instructed to self quarantine after being tested for COVID-19. The opportunity to ask questions was provided.

## 2019-06-22 ENCOUNTER — Encounter (HOSPITAL_COMMUNITY)
Admission: RE | Disposition: A | Discharge status: Home / Self Care | Payer: Self-pay | Source: Home / Self Care | Attending: Neurological Surgery

## 2019-06-22 ENCOUNTER — Other Ambulatory Visit: Payer: Self-pay

## 2019-06-22 ENCOUNTER — Inpatient Hospital Stay (HOSPITAL_COMMUNITY)
Admission: RE | Admit: 2019-06-22 | Discharge: 2019-06-23 | DRG: 473 | Disposition: A | Discharge status: Home / Self Care | Payer: Medicare HMO | Attending: Neurological Surgery | Admitting: Neurological Surgery

## 2019-06-22 ENCOUNTER — Inpatient Hospital Stay (HOSPITAL_COMMUNITY): Payer: Medicare HMO

## 2019-06-22 ENCOUNTER — Encounter (HOSPITAL_COMMUNITY): Payer: Self-pay | Admitting: Neurological Surgery

## 2019-06-22 ENCOUNTER — Inpatient Hospital Stay (HOSPITAL_COMMUNITY): Payer: Medicare HMO | Admitting: Physician Assistant

## 2019-06-22 ENCOUNTER — Inpatient Hospital Stay (HOSPITAL_COMMUNITY): Payer: Medicare HMO | Admitting: Certified Registered Nurse Anesthetist

## 2019-06-22 DIAGNOSIS — M1990 Unspecified osteoarthritis, unspecified site: Secondary | ICD-10-CM | POA: Diagnosis present

## 2019-06-22 DIAGNOSIS — Y838 Other surgical procedures as the cause of abnormal reaction of the patient, or of later complication, without mention of misadventure at the time of the procedure: Secondary | ICD-10-CM | POA: Diagnosis present

## 2019-06-22 DIAGNOSIS — Z419 Encounter for procedure for purposes other than remedying health state, unspecified: Secondary | ICD-10-CM

## 2019-06-22 DIAGNOSIS — Z8249 Family history of ischemic heart disease and other diseases of the circulatory system: Secondary | ICD-10-CM

## 2019-06-22 DIAGNOSIS — E039 Hypothyroidism, unspecified: Secondary | ICD-10-CM | POA: Diagnosis present

## 2019-06-22 DIAGNOSIS — J449 Chronic obstructive pulmonary disease, unspecified: Secondary | ICD-10-CM | POA: Diagnosis present

## 2019-06-22 DIAGNOSIS — M5412 Radiculopathy, cervical region: Secondary | ICD-10-CM | POA: Diagnosis present

## 2019-06-22 DIAGNOSIS — Z20822 Contact with and (suspected) exposure to covid-19: Secondary | ICD-10-CM | POA: Diagnosis present

## 2019-06-22 DIAGNOSIS — M960 Pseudarthrosis after fusion or arthrodesis: Principal | ICD-10-CM | POA: Diagnosis present

## 2019-06-22 DIAGNOSIS — Z79890 Hormone replacement therapy (postmenopausal): Secondary | ICD-10-CM

## 2019-06-22 HISTORY — PX: ANTERIOR CERVICAL DECOMP/DISCECTOMY FUSION: SHX1161

## 2019-06-22 LAB — COMPREHENSIVE METABOLIC PANEL
ALT: 110 U/L — ABNORMAL HIGH (ref 0–44)
AST: 100 U/L — ABNORMAL HIGH (ref 15–41)
Albumin: 3.6 g/dL (ref 3.5–5.0)
Alkaline Phosphatase: 65 U/L (ref 38–126)
Anion gap: 10 (ref 5–15)
BUN: 18 mg/dL (ref 8–23)
CO2: 24 mmol/L (ref 22–32)
Calcium: 8.8 mg/dL — ABNORMAL LOW (ref 8.9–10.3)
Chloride: 107 mmol/L (ref 98–111)
Creatinine, Ser: 0.79 mg/dL (ref 0.61–1.24)
GFR calc Af Amer: >60 mL/min (ref >60)
GFR calc non Af Amer: >60 mL/min (ref >60)
Glucose, Bld: 90 mg/dL (ref 70–99)
Potassium: 4.1 mmol/L (ref 3.5–5.1)
Sodium: 141 mmol/L (ref 135–145)
Total Bilirubin: 0.4 mg/dL (ref 0.3–1.2)
Total Protein: 6.9 g/dL (ref 6.5–8.1)

## 2019-06-22 LAB — RESPIRATORY PANEL BY RT PCR (FLU A&B, COVID)
Influenza A by PCR: NEGATIVE
Influenza B by PCR: NEGATIVE
SARS Coronavirus 2 by RT PCR: NEGATIVE

## 2019-06-22 SURGERY — ANTERIOR CERVICAL DECOMPRESSION/DISCECTOMY FUSION 3 LEVELS
Anesthesia: General | Site: Spine Cervical

## 2019-06-22 MED ORDER — CEFAZOLIN SODIUM-DEXTROSE 2-4 GM/100ML-% IV SOLN
INTRAVENOUS | Status: AC
  Filled 2019-06-22: qty 100

## 2019-06-22 MED ORDER — SODIUM CHLORIDE 0.9% FLUSH: 3.0000 mL | INTRAVENOUS | Status: DC | PRN

## 2019-06-22 MED ORDER — ROCURONIUM BROMIDE 10 MG/ML (PF) SYRINGE
PREFILLED_SYRINGE | INTRAVENOUS | Status: DC | PRN
  Administered 2019-06-22: 60 mg via INTRAVENOUS
  Administered 2019-06-22: 40 mg via INTRAVENOUS

## 2019-06-22 MED ORDER — BACLOFEN 10 MG PO TABS
10.0000 mg | ORAL_TABLET | Freq: Three times a day (TID) | ORAL | Status: DC
  Administered 2019-06-22 (×2): 10 mg via ORAL
  Filled 2019-06-22 (×2): qty 1

## 2019-06-22 MED ORDER — FENTANYL CITRATE (PF) 250 MCG/5ML IJ SOLN
INTRAMUSCULAR | Status: DC | PRN
  Administered 2019-06-22: 100 ug via INTRAVENOUS
  Administered 2019-06-22 (×2): 50 ug via INTRAVENOUS

## 2019-06-22 MED ORDER — ACETAMINOPHEN 650 MG RE SUPP: 650.0000 mg | RECTAL | Status: DC | PRN

## 2019-06-22 MED ORDER — CHLORHEXIDINE GLUCONATE CLOTH 2 % EX PADS: 6.0000 | MEDICATED_PAD | Freq: Once | CUTANEOUS | Status: DC

## 2019-06-22 MED ORDER — MIDAZOLAM HCL 2 MG/2ML IJ SOLN
INTRAMUSCULAR | Status: DC | PRN
  Administered 2019-06-22: 2 mg via INTRAVENOUS

## 2019-06-22 MED ORDER — PHENOL 1.4 % MT LIQD: 1.0000 | OROMUCOSAL | Status: DC | PRN

## 2019-06-22 MED ORDER — FENTANYL CITRATE (PF) 100 MCG/2ML IJ SOLN
INTRAMUSCULAR | Status: AC
  Filled 2019-06-22: qty 2

## 2019-06-22 MED ORDER — DEXAMETHASONE SODIUM PHOSPHATE 10 MG/ML IJ SOLN
INTRAMUSCULAR | Status: DC | PRN
  Administered 2019-06-22: 10 mg via INTRAVENOUS

## 2019-06-22 MED ORDER — ONDANSETRON HCL 4 MG/2ML IJ SOLN
INTRAMUSCULAR | Status: AC
  Filled 2019-06-22: qty 2

## 2019-06-22 MED ORDER — MENTHOL 3 MG MT LOZG: 1.0000 | LOZENGE | OROMUCOSAL | Status: DC | PRN

## 2019-06-22 MED ORDER — OXYCODONE HCL 5 MG PO TABS: 5.0000 mg | ORAL_TABLET | Freq: Once | ORAL | Status: DC | PRN

## 2019-06-22 MED ORDER — LEVOTHYROXINE SODIUM 200 MCG PO TABS
400.0000 ug | ORAL_TABLET | Freq: Every day | ORAL | Status: DC
  Administered 2019-06-23: 400 ug via ORAL
  Filled 2019-06-22: qty 2

## 2019-06-22 MED ORDER — OXYCODONE HCL 5 MG PO TABS
10.0000 mg | ORAL_TABLET | ORAL | Status: DC | PRN
  Administered 2019-06-22 – 2019-06-23 (×4): 10 mg via ORAL
  Filled 2019-06-22 (×4): qty 2

## 2019-06-22 MED ORDER — VITAMIN D (ERGOCALCIFEROL) 1.25 MG (50000 UNIT) PO CAPS
50000.0000 [IU] | ORAL_CAPSULE | ORAL | Status: DC
  Filled 2019-06-22: qty 1

## 2019-06-22 MED ORDER — ONDANSETRON HCL 4 MG/2ML IJ SOLN: 4.0000 mg | Freq: Once | INTRAMUSCULAR | Status: DC | PRN

## 2019-06-22 MED ORDER — 0.9 % SODIUM CHLORIDE (POUR BTL) OPTIME
TOPICAL | Status: DC | PRN
  Administered 2019-06-22: 1000 mL

## 2019-06-22 MED ORDER — ONDANSETRON HCL 4 MG/2ML IJ SOLN: 4.0000 mg | Freq: Four times a day (QID) | INTRAMUSCULAR | Status: DC | PRN

## 2019-06-22 MED ORDER — POLYETHYLENE GLYCOL 3350 17 G PO PACK: 17.0000 g | PACK | Freq: Every day | ORAL | Status: DC | PRN

## 2019-06-22 MED ORDER — CEFAZOLIN SODIUM-DEXTROSE 2-4 GM/100ML-% IV SOLN
2.0000 g | INTRAVENOUS | Status: AC
  Administered 2019-06-22: 2 g via INTRAVENOUS

## 2019-06-22 MED ORDER — DEXAMETHASONE SODIUM PHOSPHATE 10 MG/ML IJ SOLN
INTRAMUSCULAR | Status: AC
  Filled 2019-06-22: qty 1

## 2019-06-22 MED ORDER — LIDOCAINE 2% (20 MG/ML) 5 ML SYRINGE
INTRAMUSCULAR | Status: AC
  Filled 2019-06-22: qty 5

## 2019-06-22 MED ORDER — OXYCODONE HCL ER 15 MG PO T12A: 15.0000 mg | EXTENDED_RELEASE_TABLET | Freq: Two times a day (BID) | ORAL | Status: DC

## 2019-06-22 MED ORDER — ONDANSETRON HCL 4 MG/2ML IJ SOLN
INTRAMUSCULAR | Status: DC | PRN
  Administered 2019-06-22 (×2): 4 mg via INTRAVENOUS

## 2019-06-22 MED ORDER — GABAPENTIN 600 MG PO TABS
600.0000 mg | ORAL_TABLET | Freq: Three times a day (TID) | ORAL | Status: DC
  Administered 2019-06-22 (×2): 600 mg via ORAL
  Filled 2019-06-22 (×2): qty 1

## 2019-06-22 MED ORDER — LIDOCAINE-EPINEPHRINE 1 %-1:100000 IJ SOLN
INTRAMUSCULAR | Status: AC
  Filled 2019-06-22: qty 1

## 2019-06-22 MED ORDER — ESMOLOL HCL 100 MG/10ML IV SOLN
INTRAVENOUS | Status: DC | PRN
  Administered 2019-06-22: 20 mg via INTRAVENOUS

## 2019-06-22 MED ORDER — MIDAZOLAM HCL 2 MG/2ML IJ SOLN
INTRAMUSCULAR | Status: AC
  Filled 2019-06-22: qty 2

## 2019-06-22 MED ORDER — PROPOFOL 10 MG/ML IV BOLUS
INTRAVENOUS | Status: AC
  Filled 2019-06-22: qty 40

## 2019-06-22 MED ORDER — DOCUSATE SODIUM 100 MG PO CAPS
100.0000 mg | ORAL_CAPSULE | Freq: Two times a day (BID) | ORAL | Status: DC
  Administered 2019-06-22: 100 mg via ORAL
  Filled 2019-06-22: qty 1

## 2019-06-22 MED ORDER — PHENYLEPHRINE HCL-NACL 10-0.9 MG/250ML-% IV SOLN
INTRAVENOUS | Status: DC | PRN
  Administered 2019-06-22: 25 ug/min via INTRAVENOUS

## 2019-06-22 MED ORDER — SUFENTANIL CITRATE 250 MCG/5ML IV SOLN
0.2500 ug/kg/h | INTRAVENOUS | Status: AC
  Administered 2019-06-22: .2 ug/kg/h via INTRAVENOUS
  Filled 2019-06-22 (×2): qty 5

## 2019-06-22 MED ORDER — ALBUTEROL SULFATE (2.5 MG/3ML) 0.083% IN NEBU: 2.5000 mg | INHALATION_SOLUTION | Freq: Four times a day (QID) | RESPIRATORY_TRACT | Status: DC | PRN

## 2019-06-22 MED ORDER — DULOXETINE HCL 30 MG PO CPEP
30.0000 mg | ORAL_CAPSULE | Freq: Two times a day (BID) | ORAL | Status: DC
  Administered 2019-06-22: 30 mg via ORAL
  Filled 2019-06-22: qty 1

## 2019-06-22 MED ORDER — ONDANSETRON HCL 4 MG PO TABS: 4.0000 mg | ORAL_TABLET | Freq: Four times a day (QID) | ORAL | Status: DC | PRN

## 2019-06-22 MED ORDER — ROCURONIUM BROMIDE 10 MG/ML (PF) SYRINGE
PREFILLED_SYRINGE | INTRAVENOUS | Status: AC
  Filled 2019-06-22: qty 10

## 2019-06-22 MED ORDER — CEFAZOLIN SODIUM-DEXTROSE 2-4 GM/100ML-% IV SOLN
2.0000 g | Freq: Three times a day (TID) | INTRAVENOUS | Status: AC
  Administered 2019-06-22 – 2019-06-23 (×2): 2 g via INTRAVENOUS
  Filled 2019-06-22 (×2): qty 100

## 2019-06-22 MED ORDER — PHENYLEPHRINE 40 MCG/ML (10ML) SYRINGE FOR IV PUSH (FOR BLOOD PRESSURE SUPPORT)
PREFILLED_SYRINGE | INTRAVENOUS | Status: DC | PRN
  Administered 2019-06-22 (×2): 80 ug via INTRAVENOUS

## 2019-06-22 MED ORDER — FENTANYL CITRATE (PF) 100 MCG/2ML IJ SOLN
25.0000 ug | INTRAMUSCULAR | Status: DC | PRN
  Administered 2019-06-22 (×2): 25 ug via INTRAVENOUS
  Administered 2019-06-22: 50 ug via INTRAVENOUS

## 2019-06-22 MED ORDER — THROMBIN 5000 UNITS EX SOLR
CUTANEOUS | Status: AC
  Filled 2019-06-22: qty 5000

## 2019-06-22 MED ORDER — ACETAMINOPHEN 325 MG PO TABS: 650.0000 mg | ORAL_TABLET | ORAL | Status: DC | PRN

## 2019-06-22 MED ORDER — HYDROMORPHONE HCL 1 MG/ML IJ SOLN
1.0000 mg | INTRAMUSCULAR | Status: DC | PRN
  Administered 2019-06-22: 1 mg via INTRAVENOUS
  Filled 2019-06-22: qty 1

## 2019-06-22 MED ORDER — LIDOCAINE 2% (20 MG/ML) 5 ML SYRINGE
INTRAMUSCULAR | Status: DC | PRN
  Administered 2019-06-22: 60 mg via INTRAVENOUS

## 2019-06-22 MED ORDER — FENTANYL CITRATE (PF) 250 MCG/5ML IJ SOLN
INTRAMUSCULAR | Status: AC
  Filled 2019-06-22: qty 5

## 2019-06-22 MED ORDER — OXYCODONE HCL 5 MG/5ML PO SOLN: 5.0000 mg | Freq: Once | ORAL | Status: DC | PRN

## 2019-06-22 MED ORDER — OXYCODONE HCL 5 MG PO TABS: 5.0000 mg | ORAL_TABLET | ORAL | Status: DC | PRN

## 2019-06-22 MED ORDER — PROPOFOL 10 MG/ML IV BOLUS
INTRAVENOUS | Status: DC | PRN
  Administered 2019-06-22: 170 mg via INTRAVENOUS

## 2019-06-22 MED ORDER — LIDOCAINE-EPINEPHRINE 1 %-1:100000 IJ SOLN
INTRAMUSCULAR | Status: DC | PRN
  Administered 2019-06-22: 10 mL

## 2019-06-22 MED ORDER — SODIUM CHLORIDE 0.9 % IV SOLN: 250.0000 mL | INTRAVENOUS | Status: DC

## 2019-06-22 MED ORDER — THROMBIN 5000 UNITS EX SOLR
OROMUCOSAL | Status: DC | PRN
  Administered 2019-06-22: 5 mL via TOPICAL

## 2019-06-22 MED ORDER — LACTATED RINGERS IV SOLN: INTRAVENOUS | Status: DC

## 2019-06-22 MED ORDER — SODIUM CHLORIDE 0.9 % IV SOLN
INTRAVENOUS | Status: DC | PRN
  Administered 2019-06-22: 500 mL

## 2019-06-22 MED ORDER — SODIUM CHLORIDE 0.9% FLUSH
3.0000 mL | Freq: Two times a day (BID) | INTRAVENOUS | Status: DC
  Administered 2019-06-22: 3 mL via INTRAVENOUS

## 2019-06-22 SURGICAL SUPPLY — 60 items
ADH SKN CLS APL DERMABOND .7 (GAUZE/BANDAGES/DRESSINGS) ×1
APL SKNCLS STERI-STRIP NONHPOA (GAUZE/BANDAGES/DRESSINGS)
BAG DECANTER FOR FLEXI CONT (MISCELLANEOUS) ×2 IMPLANT
BENZOIN TINCTURE PRP APPL 2/3 (GAUZE/BANDAGES/DRESSINGS) IMPLANT
BLADE CLIPPER SURG (BLADE) ×1 IMPLANT
BLADE SURG 11 STRL SS (BLADE) ×2 IMPLANT
BUR MATCHSTICK NEURO 3.0 LAGG (BURR) ×2 IMPLANT
CANISTER SUCT 3000ML PPV (MISCELLANEOUS) ×2 IMPLANT
COVER WAND RF STERILE (DRAPES) ×2 IMPLANT
DECANTER SPIKE VIAL GLASS SM (MISCELLANEOUS) ×2 IMPLANT
DERMABOND ADVANCED (GAUZE/BANDAGES/DRESSINGS) ×1
DERMABOND ADVANCED .7 DNX12 (GAUZE/BANDAGES/DRESSINGS) ×1 IMPLANT
DRAPE C-ARM 42X72 X-RAY (DRAPES) ×4 IMPLANT
DRAPE HALF SHEET 40X57 (DRAPES) ×1 IMPLANT
DRAPE LAPAROTOMY 100X72 PEDS (DRAPES) ×2 IMPLANT
DRAPE MICROSCOPE LEICA (MISCELLANEOUS) ×2 IMPLANT
DURAPREP 6ML APPLICATOR 50/CS (WOUND CARE) ×2 IMPLANT
ELECT COATED BLADE 2.86 ST (ELECTRODE) ×2 IMPLANT
ELECT REM PT RETURN 9FT ADLT (ELECTROSURGICAL) ×2
ELECTRODE REM PT RTRN 9FT ADLT (ELECTROSURGICAL) ×1 IMPLANT
GAUZE 4X4 16PLY RFD (DISPOSABLE) IMPLANT
GLOVE BIO SURGEON STRL SZ7.5 (GLOVE) ×2 IMPLANT
GLOVE BIOGEL PI IND STRL 7.5 (GLOVE) ×2 IMPLANT
GLOVE BIOGEL PI INDICATOR 7.5 (GLOVE) ×3
GLOVE EXAM NITRILE LRG STRL (GLOVE) IMPLANT
GLOVE EXAM NITRILE XL STR (GLOVE) IMPLANT
GLOVE EXAM NITRILE XS STR PU (GLOVE) IMPLANT
GLOVE SURG SS PI 7.5 STRL IVOR (GLOVE) ×2 IMPLANT
GOWN STRL REUS W/ TWL LRG LVL3 (GOWN DISPOSABLE) ×2 IMPLANT
GOWN STRL REUS W/ TWL XL LVL3 (GOWN DISPOSABLE) IMPLANT
GOWN STRL REUS W/TWL 2XL LVL3 (GOWN DISPOSABLE) IMPLANT
GOWN STRL REUS W/TWL LRG LVL3 (GOWN DISPOSABLE) ×2
GOWN STRL REUS W/TWL XL LVL3 (GOWN DISPOSABLE) ×2
HEMOSTAT POWDER KIT SURGIFOAM (HEMOSTASIS) ×2 IMPLANT
KIT BASIN OR (CUSTOM PROCEDURE TRAY) ×2 IMPLANT
KIT TURNOVER KIT B (KITS) ×2 IMPLANT
NDL SPNL 18GX3.5 QUINCKE PK (NEEDLE) ×1 IMPLANT
NEEDLE HYPO 22GX1.5 SAFETY (NEEDLE) ×2 IMPLANT
NEEDLE SPNL 18GX3.5 QUINCKE PK (NEEDLE) ×2 IMPLANT
NS IRRIG 1000ML POUR BTL (IV SOLUTION) ×2 IMPLANT
PACK LAMINECTOMY NEURO (CUSTOM PROCEDURE TRAY) ×2 IMPLANT
PAD ARMBOARD 7.5X6 YLW CONV (MISCELLANEOUS) ×3 IMPLANT
PIN DISTRACTION 14MM (PIN) ×2 IMPLANT
PLATE 23MM (Plate) ×1 IMPLANT
PLATE ELITE VISION 25MM (Plate) ×1 IMPLANT
RUBBERBAND STERILE (MISCELLANEOUS) ×4 IMPLANT
SCREW RESCUE 13MM (Screw) ×4 IMPLANT
SCREW RESCUE VAR ANGLE 13 (Screw) IMPLANT
SCREW SELF TAP VAR 4.0X13 (Screw) ×6 IMPLANT
SPACER BONE CORNERSTONE 6X14 (Orthopedic Implant) ×1 IMPLANT
SPACER BONE CORNERSTONE 7X14 (Orthopedic Implant) ×1 IMPLANT
SPONGE INTESTINAL PEANUT (DISPOSABLE) ×2 IMPLANT
STAPLER VISISTAT 35W (STAPLE) ×1 IMPLANT
SUT MNCRL AB 3-0 PS2 18 (SUTURE) ×2 IMPLANT
SUT VIC AB 3-0 SH 8-18 (SUTURE) ×3 IMPLANT
TAPE CLOTH 3X10 TAN LF (GAUZE/BANDAGES/DRESSINGS) ×1 IMPLANT
TOWEL GREEN STERILE (TOWEL DISPOSABLE) ×2 IMPLANT
TOWEL GREEN STERILE FF (TOWEL DISPOSABLE) ×2 IMPLANT
TRAY FOLEY MTR SLVR 16FR STAT (SET/KITS/TRAYS/PACK) ×2 IMPLANT
WATER STERILE IRR 1000ML POUR (IV SOLUTION) ×2 IMPLANT

## 2019-06-22 NOTE — Transfer of Care (Signed)
Immediate Anesthesia Transfer of Care Note  Patient: Samuel [name]  Procedure(s) Performed: Cervical four-Cervical five, Cervical six-Cervical seven  Anterior Cervical Decompression Fusion ,with removal hardware cervical five to cervical seven (N/A Spine Cervical)  Patient Location: PACU  Anesthesia Type:General  Level of Consciousness: awake  Airway & Oxygen Therapy: Patient Spontanous Breathing  Post-op Assessment: Report given to RN and Post -op Vital signs reviewed and stable  Post vital signs: Reviewed and stable  Last Vitals:  Vitals Value Taken Time  BP 133/75 06/22/19 1603  Temp 36.1 C 06/22/19 1603  Pulse 93 06/22/19 1615  Resp 13 06/22/19 1615  SpO2 94 % 06/22/19 1615  Vitals shown include unvalidated device data.  Last Pain:  Vitals:   06/22/19 1603  TempSrc:   PainSc: 0-No pain         Complications: No apparent anesthesia complications

## 2019-06-22 NOTE — Op Note (Signed)
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 The patient was found to have corresponding foraminal stenosis as well as a pseudoarthrosis. This was discussed with the patient as well as risks, benefits, and alternatives and the patient wished to proceed with surgical treatment.   OPERATIVE DETAIL: The patient was taken to the operating room and placed on the OR table in the supine position. A formal time out was performed with two patient identifiers and confirmed the operative site. Anesthesia was induced by the anesthesia team.  Fluoroscopy was used to localize the surgical level and an incision was marked in a skin crease. The area was then prepped and draped in a sterile fashion. A transverse linear incision was made on the right side of the neck. The platysma was divided and the sternocleidomastoid muscle was identified. The carotid sheath was palpated, identified, and retracted laterally with the sternocleidomastoid muscle. The strap muscles were identified and retracted medially and the pretracheal fascia was entered. A bent spinal needle was used with fluoroscopy to localize the surgical level after dissection. The longus colli were elevated bilaterally and a self-retaining retractor was placed. The endotracheal tube cuff [name] was deflated and reinflated after retractor placement.   The prior anterior cervical plate was identified and exposed. The screws were removed and the plate was  mobilized and removed. At C4-5, anterior osteophytes were removed until flush with the anterior vertebral body. The disc annulus was incised and a complete C4-C5 discectomy was performed. The posterior longitudinal ligament was incised followed by ligamentous and bony removal until no central canal stenosis was present. Decompression was then taken out laterally into the bilateral foramina until no foraminal stenosis was palpable. A 50mm cortical allograft ([employer]tr[employer]) was inserted into the disc space as an interbody graft. An anterior plate ([employer]) was positioned and 4, 33mm screws were used to secure the plate to the C4 and C5 vertebral bodies.   The C6-7 interspace was then identified and the endplates were dissected and prepared. A 71mm graft was then inserted into the disc space and an anterior plate was similarly secured to the C6 and C7 vertebral bodies.   Hemostasis was obtained and the incision was closed in layers. All instrument and sponge counts were correct. The patient was then returned to anesthesia for emergence. No apparent complications at the completion of the procedure.   EBL:    DRAINS: none   SPECIMENS: none   Jadene Pierini, MD 06/22/19 8:56 PM

## 2019-06-22 NOTE — Brief Op Note (Signed)
06/22/2019  3:57 PM  PATIENT:  Samuel [name]  69 y.o. male  PRE-OPERATIVE DIAGNOSIS:  Stenosis  POST-OPERATIVE DIAGNOSIS:  Stenosis  PROCEDURE:  Procedure(s): Cervical four-Cervical five, Cervical six-Cervical seven  Anterior Cervical Decompression Fusion ,with removal hardware cervical five to cervical seven (N/A)  SURGEON:  Surgeon(s) and Role:    * Jadene Pierini, MD - Primary    * Donalee Citrin, MD - Assisting  PHYSICIAN ASSISTANT:   ANESTHESIA:   general  EBL:  150 mL   BLOOD ADMINISTERED:none  DRAINS: none   LOCAL MEDICATIONS USED:  LIDOCAINE   SPECIMEN:  No Specimen  DISPOSITION OF SPECIMEN:  N/A  COUNTS:  YES  TOURNIQUET:  * No tourniquets in log *  DICTATION: .Note written in EPIC  PLAN OF CARE: Admit to inpatient   PATIENT DISPOSITION:  PACU - hemodynamically stable.   Delay start of Pharmacological VTE agent (>24hrs) due to surgical blood loss or risk of bleeding: yes

## 2019-06-22 NOTE — Transfer of Care (Deleted)
Immediate Anesthesia Transfer of Care Note  Patient: Samuel [name]  Procedure(s) Performed: Cervical four-Cervical five, Cervical six-Cervical seven  Anterior Cervical Decompression Fusion ,with removal hardware cervical five to cervical seven (N/A Spine Cervical)  Patient Location: PACU  Anesthesia Type:General  Level of Consciousness: awake  Airway & Oxygen Therapy: Patient Spontanous Breathing  Post-op Assessment: Report given to RN and Post -op Vital signs reviewed and stable  Post vital signs: Reviewed and stable  Last Vitals:  Vitals Value Taken Time  BP 133/75 06/22/19 1603  Temp    Pulse 100 06/22/19 1606  Resp 23 06/22/19 1606  SpO2 95 % 06/22/19 1606  Vitals shown include unvalidated device data.  Last Pain:  Vitals:   06/22/19 1019  TempSrc: Oral  PainSc: 8          Complications: No apparent anesthesia complications

## 2019-06-22 NOTE — Anesthesia Procedure Notes (Signed)
Procedure Name: Intubation Date/Time: 06/22/2019 1:34 PM Performed by: Modena Morrow, CRNA Pre-anesthesia Checklist: Patient identified, Emergency Drugs available, Suction available and Patient being monitored Patient Re-evaluated:Patient Re-evaluated prior to induction Oxygen Delivery Method: Circle system utilized Preoxygenation: Pre-oxygenation with 100% oxygen Induction Type: IV induction Ventilation: Mask ventilation without difficulty Laryngoscope Size: Miller and 3 Grade View: Grade I Tube type: Oral Tube size: 7.5 mm Number of attempts: 1 Airway Equipment and Method: Stylet and Oral airway Placement Confirmation: ETT inserted through vocal cords under direct vision,  positive ETCO2 and breath sounds checked- equal and bilateral Secured at: 22 cm Tube secured with: Tape Dental Injury: Teeth and Oropharynx as per pre-operative assessment

## 2019-06-22 NOTE — H&P (Signed)
Surgical H&P Update  HPI: 69 y.o. man with a history of RUE proximal weakness. MRI showed pseudoarthrosis of his prior ACDF along with foraminal stenosis. EMG confirmed weakness in the right C5 myotome, his adjacent segment. No changes in health since he was last seen. Still having weakness and wishes to proceed with surgery.  PMHx:  Past Medical History:  Diagnosis Date  . Arthritis   . COPD (chronic obstructive pulmonary disease) (HCC)   . Hepatitis    per patient about 10 years ago and got treatment for it  . Hypothyroidism    FamHx:  Family History  Problem Relation Age of Onset  . Heart disease Mother   . Lung disease Father    SocHx:  reports that he has never smoked. He has never used smokeless tobacco. He reports that he does not drink alcohol or use drugs.  Physical Exam: AOx3, PERRL, FS, TM  Strength 5/5 x4 except for RUE deltoid 1/5, biceps 4-/5, SILTx4  Assesment/Plan: 69 y.o. 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 Risks, benefits, and alternatives discussed and the patient would like to continue with surgery.  -OR today -3C post-op  Jadene Pierini, MD 06/22/19 12:51 PM

## 2019-06-22 NOTE — Progress Notes (Signed)
Orthopedic Tech Progress Note Patient Details:  Samuel [name] Oct 22, 1950 [id number] RN called requesting a SOFT COLLAR for patient Ortho Devices Type of Ortho Device: Soft collar Ortho Device/Splint Location: neck Ortho Device/Splint Interventions: Ordered, Application   Post Interventions Patient Tolerated: Well Instructions Provided: Care of device, Adjustment of device   Donald Pore 06/22/2019, 6:09 PM

## 2019-06-22 NOTE — Anesthesia Postprocedure Evaluation (Signed)
Anesthesia Post Note  Patient: Samuel [name]  Procedure(s) Performed: Cervical four-Cervical five, Cervical six-Cervical seven  Anterior Cervical Decompression Fusion ,with removal hardware cervical five to cervical seven (N/A Spine Cervical)     Patient location during evaluation: PACU Anesthesia Type: General Level of consciousness: awake and alert Pain management: pain level controlled Vital Signs Assessment: post-procedure vital signs reviewed and stable Respiratory status: spontaneous breathing, nonlabored ventilation, respiratory function stable and patient connected to nasal cannula oxygen Cardiovascular status: blood pressure returned to baseline and stable Postop Assessment: no apparent nausea or vomiting Anesthetic complications: no    Last Vitals:  Vitals:   06/22/19 1658 06/22/19 1716  BP:  (!) 149/88  Pulse:  (!) 106  Resp:  19  Temp:  36.7 C  SpO2: 95%     Last Pain:  Vitals:   06/22/19 1716  TempSrc: Oral  PainSc:                  [name]

## 2019-06-23 ENCOUNTER — Encounter: Payer: Self-pay | Admitting: *Deleted

## 2019-06-23 DIAGNOSIS — Z8249 Family history of ischemic heart disease and other diseases of the circulatory system: Secondary | ICD-10-CM | POA: Diagnosis not present

## 2019-06-23 DIAGNOSIS — R531 Weakness: Secondary | ICD-10-CM | POA: Diagnosis present

## 2019-06-23 DIAGNOSIS — M5412 Radiculopathy, cervical region: Secondary | ICD-10-CM | POA: Diagnosis present

## 2019-06-23 DIAGNOSIS — Z20822 Contact with and (suspected) exposure to covid-19: Secondary | ICD-10-CM | POA: Diagnosis present

## 2019-06-23 DIAGNOSIS — M960 Pseudarthrosis after fusion or arthrodesis: Secondary | ICD-10-CM | POA: Diagnosis present

## 2019-06-23 DIAGNOSIS — Y838 Other surgical procedures as the cause of abnormal reaction of the patient, or of later complication, without mention of misadventure at the time of the procedure: Secondary | ICD-10-CM | POA: Diagnosis present

## 2019-06-23 DIAGNOSIS — Z79890 Hormone replacement therapy (postmenopausal): Secondary | ICD-10-CM | POA: Diagnosis not present

## 2019-06-23 DIAGNOSIS — M1990 Unspecified osteoarthritis, unspecified site: Secondary | ICD-10-CM | POA: Diagnosis present

## 2019-06-23 DIAGNOSIS — J449 Chronic obstructive pulmonary disease, unspecified: Secondary | ICD-10-CM | POA: Diagnosis present

## 2019-06-23 DIAGNOSIS — E039 Hypothyroidism, unspecified: Secondary | ICD-10-CM | POA: Diagnosis present

## 2019-06-23 MED ORDER — GABAPENTIN 600 MG PO TABS: 600.0000 mg | ORAL_TABLET | Freq: Three times a day (TID) | ORAL | 3 refills | Status: AC

## 2019-06-23 MED ORDER — OXYCODONE HCL 15 MG PO TABS: 15.0000 mg | ORAL_TABLET | ORAL | 0 refills | Status: AC | PRN

## 2019-06-23 MED ORDER — NAPROXEN 500 MG PO TABS: 500.0000 mg | ORAL_TABLET | Freq: Two times a day (BID) | ORAL | 5 refills | Status: AC

## 2019-06-23 NOTE — Progress Notes (Signed)
Neurosurgery Service Progress Note  Subjective: No acute events overnight, expected post-op neck pain, weakness surprisingly already improving   Objective: Vitals:   06/22/19 2007 06/22/19 2302 06/23/19 0255 06/23/19 0732  BP: 111/79 116/67 110/77 122/71  Pulse: 99 94 80 81  Resp: 18 20 18 16   Temp: 98.4 F (36.9 C) 99.5 F (37.5 C) 98.1 F (36.7 C) 98.2 F (36.8 C)  TempSrc: Oral Oral Oral Oral  SpO2: 94% 95% 95% 96%  Weight:      Height:       Temp (24hrs), Avg:98.3 F (36.8 C), Min:97 F (36.1 C), Max:99.5 F (37.5 C)  CBC Latest Ref Rng & Units 06/16/2019 11/10/2018 05/29/2018  WBC 4.0 - 10.5 K/uL 5.9 4.9 13.8(H)  Hemoglobin 13.0 - 17.0 g/dL 14.4 12.0(L) 11.7(L)  Hematocrit 39.0 - 52.0 % 47.1 38.1(L) 36.5(L)  Platelets 150 - 400 K/uL 248 375 277   BMP Latest Ref Rng & Units 06/22/2019 11/10/2018 05/29/2018  Glucose 70 - 99 mg/dL 90 108(H) 110(H)  BUN 8 - 23 mg/dL 18 12 24(H)  Creatinine 0.61 - 1.24 mg/dL 0.79 0.75 0.69  Sodium 135 - 145 mmol/L 141 137 137  Potassium 3.5 - 5.1 mmol/L 4.1 4.1 3.6  Chloride 98 - 111 mmol/L 107 105 111  CO2 22 - 32 mmol/L 24 24 22   Calcium 8.9 - 10.3 mg/dL 8.8(L) 8.3(L) 8.0(L)    Intake/Output Summary (Last 24 hours) at 06/23/2019 0824 Last data filed at 06/22/2019 2126 Gross per 24 hour  Intake 1203 ml  Output 580 ml  Net 623 ml    Current Facility-Administered Medications:  .  0.9 %  sodium chloride infusion, 250 mL, Intravenous, Continuous, Sinahi Knights A, MD .  acetaminophen (TYLENOL) tablet 650 mg, 650 mg, Oral, Q4H PRN **OR** acetaminophen (TYLENOL) suppository 650 mg, 650 mg, Rectal, Q4H PRN, Yoanna Jurczyk A, MD .  albuterol (PROVENTIL) (2.5 MG/3ML) 0.083% nebulizer solution 2.5 mg, 2.5 mg, Nebulization, Q6H PRN, Judith Part, MD .  baclofen (LIORESAL) tablet 10 mg, 10 mg, Oral, TID, Judith Part, MD, 10 mg at 06/22/19 2121 .  docusate sodium (COLACE) capsule 100 mg, 100 mg, Oral, BID, Judith Part, MD,  100 mg at 06/22/19 2121 .  DULoxetine (CYMBALTA) DR capsule 30 mg, 30 mg, Oral, BID, Judith Part, MD, 30 mg at 06/22/19 2121 .  gabapentin (NEURONTIN) tablet 600 mg, 600 mg, Oral, TID, Judith Part, MD, 600 mg at 06/22/19 2121 .  HYDROmorphone (DILAUDID) injection 1 mg, 1 mg, Intravenous, Q3H PRN, Judith Part, MD, 1 mg at 06/22/19 1750 .  levothyroxine (SYNTHROID) tablet 400 mcg, 400 mcg, Oral, Q0600, Judith Part, MD, 400 mcg at 06/23/19 0507 .  menthol-cetylpyridinium (CEPACOL) lozenge 3 mg, 1 lozenge, Oral, PRN **OR** phenol (CHLORASEPTIC) mouth spray 1 spray, 1 spray, Mouth/Throat, PRN, Geetika Laborde A, MD .  ondansetron (ZOFRAN) tablet 4 mg, 4 mg, Oral, Q6H PRN **OR** ondansetron (ZOFRAN) injection 4 mg, 4 mg, Intravenous, Q6H PRN, Kadar Chance A, MD .  oxyCODONE (Oxy IR/ROXICODONE) immediate release tablet 10 mg, 10 mg, Oral, Q4H PRN, Judith Part, MD, 10 mg at 06/23/19 0720 .  oxyCODONE (Oxy IR/ROXICODONE) immediate release tablet 5 mg, 5 mg, Oral, Q4H PRN, Judith Part, MD .  oxyCODONE (OXYCONTIN) 12 hr tablet 15 mg, 15 mg, Oral, BID, Lazer Wollard A, MD .  polyethylene glycol (MIRALAX / GLYCOLAX) packet 17 g, 17 g, Oral, Daily PRN, Judith Part, MD .  sodium chloride  flush (NS) 0.9 % injection 3 mL, 3 mL, Intravenous, Q12H, Haziel Molner, Clovis Pu, MD, 3 mL at 06/22/19 2126 .  sodium chloride flush (NS) 0.9 % injection 3 mL, 3 mL, Intravenous, PRN, Colyn Miron, Clovis Pu, MD .  Vitamin D (Ergocalciferol) (DRISDOL) capsule 50,000 Units, 50,000 Units, Oral, Q Tue, Samanta Gal, Clovis Pu, MD   Physical Exam: AOx3, PERRL, EOMI, FS, Strength 5/5 x4 except R deltoid 3/5, SILTx4  Assessment & Plan: 69 y.o. man s/p 3 level ACDF, recovering well.  -discharge home today  Jadene Pierini  06/23/19 8:24 AM

## 2019-06-23 NOTE — Evaluation (Addendum)
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    Clinical Impression  Patient evaluated by Physical Therapy with no further acute PT needs identified. All education has been completed and the patient has no further questions. Pt with baseline mobility deficits that are unchanged since surgery. Pt was able to demonstrate transfers and ambulation with gross modified independence to supervision for safety with SPC. Pt was educated on precautions, brace application/wearing schedule, appropriate activity progression, and car transfer. See below for any follow-up Physical Therapy or equipment needs. PT is signing off. Thank you for this referral.    Follow Up Recommendations No PT follow up;Supervision for mobility/OOB    Equipment Recommendations  None recommended by PT    Recommendations for Other Services       Precautions / Restrictions Precautions Precautions: Cervical Precaution Booklet Issued: Yes (comment) Precaution Comments: reviewed with patient Required Braces or Orthoses: Cervical Brace Cervical Brace: Soft collar;For comfort(no brace per order) Restrictions Weight Bearing Restrictions: No Other Position/Activity Restrictions: 0      Mobility  Bed Mobility Overal bed mobility: Modified Independent Bed Mobility: Rolling;Sidelying to Sit Rolling: Modified independent (Device/Increase time) Sidelying to sit: Modified independent (Device/Increase time)     Sit to sidelying: Supervision General bed mobility comments: Mild cues for proper log roll technique. Pt was able to perform transfer to EOB without  assistance. Reports he will be sleeping in the recliner initially.   Transfers Overall transfer level: Modified independent Equipment used: None Transfers: Sit to/from Stand Sit to Stand: Supervision         General transfer comment: Light supervision for safety. No unsteadiness or LOB noted.   Ambulation/Gait Ambulation/Gait assistance: Min guard;Supervision Gait Distance (Feet): 250 Feet Assistive device: Straight cane Gait Pattern/deviations: Step-through pattern;Decreased stride length;Antalgic Gait velocity: Decreased Gait velocity interpretation: <1.8 ft/sec, indicate of risk for recurrent falls General Gait Details: VC's for improved posture. Pt initially without UE support and reaching for railings in hall. Pt improved with SPC. Continues to be antalgic however pt reports this is baseline.   Stairs            Wheelchair Mobility    Modified Rankin (Stroke Patients Only)       Balance Overall balance assessment: Mild deficits observed, not formally tested                                           Pertinent Vitals/Pain Pain Assessment: Faces Faces Pain Scale: Hurts even more Pain Location: neck, shoulder, R arm Pain Descriptors / Indicators: Discomfort;Grimacing;Operative site guarding;Pressure Pain Intervention(s): Limited activity within patient's tolerance;Monitored during session;Repositioned    Home Living Family/patient expects to be discharged to:: Private residence Living Arrangements: Children Available Help at Discharge: Family;Available 24 hours/day Type of Home: House Home Access: Level entry     Home Layout: One level Home Equipment: Cane - single point Additional Comments: Lives with son who is an Teacher, adult education with amputated leg - not able to provide significant assistance per pt.     Prior Function Level of  Independence: Independent with assistive device(s)         Comments: reports using cane as needed,  independent ADLs, IADLs, driving     Hand Dominance   Dominant Hand: Right    Extremity/Trunk Assessment   Upper Extremity Assessment Upper Extremity Assessment: Defer to OT evaluation RUE Deficits / Details: limited shoulder ROM, AROM (abduction 45, flex 10), PROM limited by pain flex to 30*; elbow and distal WFL  RUE: Unable to fully assess due to pain RUE Sensation: WNL RUE Coordination: decreased gross motor    Lower Extremity Assessment Lower Extremity Assessment: RLE deficits/detail;Generalized weakness RLE Deficits / Details: LE weakness - pt reports he "walks with a limp" at baseline.     Cervical / Trunk Assessment Cervical / Trunk Assessment: Other exceptions Cervical / Trunk Exceptions: s/p surgery  Communication   Communication: No difficulties  Cognition Arousal/Alertness: Awake/alert Behavior During Therapy: WFL for tasks assessed/performed Overall Cognitive Status: Within Functional Limits for tasks assessed                                        General Comments General comments (skin integrity, edema, etc.): educated on PROM/SROM exercises to R shoulder flexion (within pain limitations)     Exercises     Assessment/Plan    PT Assessment Patent does not need any further PT services  PT Problem List         PT Treatment Interventions      PT Goals (Current goals can be found in the Care Plan section)  Acute Rehab PT Goals Patient Stated Goal: less pain  PT Goal Formulation: All assessment and education complete, DC therapy    Frequency     Barriers to discharge        Co-evaluation               AM-PAC PT "6 Clicks" Mobility  Outcome Measure Help needed turning from your back to your side while in a flat bed without using bedrails?: None Help needed moving from lying on your back to sitting on the side of a flat bed without using bedrails?: None Help needed moving to and from a bed to a chair (including a  wheelchair)?: None Help needed standing up from a chair using your arms (e.g., wheelchair or bedside chair)?: None Help needed to walk in hospital room?: None Help needed climbing 3-5 steps with a railing? : A Little 6 Click Score: 23    End of Session Equipment Utilized During Treatment: Gait belt Activity Tolerance: Patient tolerated treatment well Patient left: in chair;with call bell/phone within reach Nurse Communication: Mobility status PT Visit Diagnosis: Unsteadiness on feet (R26.81);Pain Pain - part of body: (neck)    Time: 9476-5465 PT Time Calculation (min) (ACUTE ONLY): 26 min   Charges:   PT Evaluation $PT Eval Low Complexity: 1 Low PT Treatments $Gait Training: 8-22 mins        Conni Slipper, PT, DPT Acute Rehabilitation Services Pager: [phone] Office: [phone]   Marylynn Pearson 06/23/2019, 9:48 AM

## 2019-06-23 NOTE — Progress Notes (Signed)
Pt doing well. Pt given D/C instructions with verbal understanding. Rx's were sent to pharmacy by MD. Pt's incision is clean and dry with no sign of infection. Pt's IV was removed prior to D/C. 3-n-1 was given to Pt per MD order. Pt D/C'd home via wheelchair per MD order. Pt is stable @ D/C and has no other needs at this time. Rema Fendt, RN

## 2019-06-23 NOTE — Discharge Summary (Signed)
Discharge Summary  Date of Admission: 06/22/2019  Date of Discharge: 06/23/19  Attending Physician: Autumn Patty, MD  Hospital Course: Patient was admitted following an uncomplicated 3 level ACDF. He was recovered in PACU and transferred to [facility]. His preop weakness already started improving by POD1. His hospital course was uncomplicated and the patient was discharged home on 06/23/2019. He will follow up in clinic with me in 2 weeks.  Neurologic exam at discharge:  AOx3, PERRL, EOMI, FS, TM Strength 5/5 x4 except 3/5 in R deltoid, SILTx4  Discharge diagnosis: Cervical radiculopathy  Jadene Pierini, MD 06/23/19 8:23 AM

## 2019-06-23 NOTE — Discharge Instructions (Signed)
Discharge Instructions ° °No restriction in activities, slowly increase your activity back to normal.  ° °Your incision is closed with dermabond (purple glue). This will naturally fall off over the next 1-2 weeks.  ° °Okay to shower on the day of discharge. Use regular soap and water and try to be gentle when cleaning your incision.  ° °Follow up with Dr. Dereke Neumann in 2 weeks after discharge. If you do not already have a discharge appointment, please call his office at [phone] to schedule a follow up appointment. If you have any concerns or questions, please call the office and let us know. °

## 2019-06-23 NOTE — Evaluation (Signed)
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   Clinical Impression   PTA patient independent and driving. Admitted for above and limited by problem list below, including precautions, R shoulder pain and limited functional ROM.  He currently requires min assist to supervision for ADLs, min guard for transfer and in room mobility.  Patient reports improved R shoulder ROM from before surgery, but continues to be limited by pain (AROM FF to 10*, abduction to 45*; PROM FF to 30*); educated on exercises to for SROM/PROM to R shoulder. Pt educated on brace mgmt and wear schedule, safety, precautions, ADL compensatory techniques, DME and recommendations.  He will benefit from further OT services while admitted in order to optimize independence and safety with ADLS, R shoulder ROM.  Anticipate no further OT needs after dc home, but recommend follow up with OP OT after cleared by surgeon.  Will follow.     Follow Up Recommendations  Follow surgeon's recommendation for DC plan and follow-up therapies;Supervision - Intermittent(recommend OP OT once cleared by MD )    Equipment Recommendations  3 in 1 bedside commode    Recommendations for Other Services PT consult     Precautions / Restrictions Precautions Precautions: Cervical Precaution Booklet Issued: Yes (comment) Precaution Comments: reviewed with patient Required Braces or Orthoses: Cervical Brace Cervical Brace: Soft collar;For comfort(no brace per order) Restrictions Weight Bearing Restrictions: No Other Position/Activity Restrictions: 0      Mobility Bed Mobility Overal bed  mobility: Needs Assistance Bed Mobility: Rolling;Sidelying to Sit;Sit to Sidelying Rolling: Supervision Sidelying to sit: Supervision     Sit to sidelying: Supervision General bed mobility comments: supervision to ensure use of log roll technique   Transfers Overall transfer level: Needs assistance Equipment used: None Transfers: Sit to/from Stand Sit to Stand: Min guard         General transfer comment: min guard for safety/balance    Balance Overall balance assessment: Mild deficits observed, not formally tested                                         ADL either performed or assessed with clinical judgement   ADL Overall ADL's : Needs assistance/impaired     Grooming: Min guard;Standing   Upper Body Bathing: Set up;Sitting   Lower Body Bathing: Sit to/from stand;Min guard   Upper Body Dressing : Set up;Sitting Upper Body Dressing Details (indicate cue type and reason): educated on compensatory techniques, pt used to technique due to R shoulder limitations  Lower Body Dressing: Min guard;Sit to/from stand   Toilet Transfer: Min guard;Ambulation;Grab bars   Toileting- Clothing Manipulation and Hygiene: Supervision/safety;Sit to/from stand       Functional mobility during ADLs: Min guard;Supervision/safety General ADL Comments: pt educated on brace mgmt and wear schedule, cervical precautions, ADL compenstory techniques, recommendations and safety     Vision   Vision Assessment?: No apparent visual deficits     Perception     Praxis      Pertinent Vitals/Pain Pain Assessment: Faces Faces Pain Scale: Hurts even more Pain Location:  neck, shoulder, R arm Pain Descriptors / Indicators: Discomfort;Grimacing;Operative site guarding;Pressure Pain Intervention(s): Monitored during session;Repositioned;Limited activity within patient's tolerance;Premedicated before session     Hand Dominance Right   Extremity/Trunk Assessment Upper Extremity  Assessment Upper Extremity Assessment: RUE deficits/detail RUE Deficits / Details: limited shoulder ROM, AROM (abduction 45, flex 10), PROM limited by pain flex to 30*; elbow and distal WFL  RUE: Unable to fully assess due to pain RUE Sensation: WNL RUE Coordination: decreased gross motor   Lower Extremity Assessment Lower Extremity Assessment: Defer to PT evaluation       Communication Communication Communication: No difficulties   Cognition Arousal/Alertness: Awake/alert Behavior During Therapy: WFL for tasks assessed/performed Overall Cognitive Status: Within Functional Limits for tasks assessed                                     General Comments  educated on PROM/SROM exercises to R shoulder flexion (within pain limitations)     Exercises     Shoulder Instructions      Home Living Family/patient expects to be discharged to:: Private residence Living Arrangements: Children Available Help at Discharge: Family;Available 24 hours/day Type of Home: House Home Access: Level entry     Home Layout: One level     Bathroom Shower/Tub: Producer, television/film/video: Standard     Home Equipment: Cane - single point          Prior Functioning/Environment Level of Independence: Independent with assistive device(s)        Comments: reports using cane as needed, independent ADLs, IADLs, driving        OT Problem List: Decreased strength;Decreased range of motion;Decreased activity tolerance;Impaired balance (sitting and/or standing);Decreased coordination;Decreased knowledge of precautions;Decreased knowledge of use of DME or AE;Pain;Impaired UE functional use      OT Treatment/Interventions: Self-care/ADL training;Therapeutic exercise;Therapeutic activities;Patient/family education;Balance training;DME and/or AE instruction    OT Goals(Current goals can be found in the care plan section) Acute Rehab OT Goals Patient Stated Goal: less pain  OT  Goal Formulation: With patient Time For Goal Achievement: 07/07/19 Potential to Achieve Goals: Good  OT Frequency: Min 2X/week   Barriers to D/C:            Co-evaluation              AM-PAC OT "6 Clicks" Daily Activity     Outcome Measure Help from another person eating meals?: None Help from another person taking care of personal grooming?: A Little Help from another person toileting, which includes using toliet, bedpan, or urinal?: A Little Help from another person bathing (including washing, rinsing, drying)?: A Little Help from another person to put on and taking off regular upper body clothing?: A Little Help from another person to put on and taking off regular lower body clothing?: A Little 6 Click Score: 19   End of Session Equipment Utilized During Treatment: Cervical collar Nurse Communication: Mobility status  Activity Tolerance: Patient tolerated treatment well Patient left: in bed;with call bell/phone within reach  OT Visit Diagnosis: Other abnormalities of gait and mobility (R26.89);Muscle weakness (generalized) (M62.81);Pain Pain - Right/Left: Right Pain - part of body: Shoulder                Time: 2595-6387 OT Time Calculation (min): 16 min Charges:  OT General Charges $OT Visit: 1 Visit OT Evaluation $OT Eval Low Complexity: 1 Low  Lititia Sen B,  OT Acute Rehabilitation Services Pager [phone] Office [phone]    Chancy Milroy 06/23/2019, 8:29 AM

## 2019-08-20 ENCOUNTER — Other Ambulatory Visit: Payer: Self-pay | Admitting: Neurological Surgery

## 2019-08-20 DIAGNOSIS — M461 Sacroiliitis, not elsewhere classified: Secondary | ICD-10-CM

## 2019-08-25 ENCOUNTER — Other Ambulatory Visit: Payer: Medicare HMO

## 2019-08-25 ENCOUNTER — Inpatient Hospital Stay: Admission: RE | Admit: 2019-08-25 | Payer: Medicare HMO | Source: Ambulatory Visit

## 2019-08-31 ENCOUNTER — Other Ambulatory Visit: Payer: Medicare HMO

## 2019-09-02 ENCOUNTER — Other Ambulatory Visit: Payer: Medicare HMO

## 2019-11-04 ENCOUNTER — Ambulatory Visit: Payer: Medicare HMO

## 2019-11-04 ENCOUNTER — Other Ambulatory Visit: Payer: Self-pay

## 2019-11-04 ENCOUNTER — Ambulatory Visit: Payer: Medicare HMO | Admitting: Orthopedic Surgery

## 2019-11-04 VITALS — BP 145/83 | HR 72 | Ht 65.0 in | Wt 176.0 lb

## 2019-11-04 DIAGNOSIS — M25562 Pain in left knee: Secondary | ICD-10-CM

## 2019-11-04 DIAGNOSIS — G8929 Other chronic pain: Secondary | ICD-10-CM

## 2019-11-04 NOTE — Patient Instructions (Signed)

## 2019-11-04 NOTE — Progress Notes (Addendum)
Chief Complaint  Patient presents with  . Follow-up    Recheck on left knee, DOS 11-13-18.    69 year old male status post arthroscopy left knee has arthritis in all 3 compartments  Presents with a varus knee medial pain which is increasing associated with some giving way  Recently had neck fusion for the fourth time is also had multiple back surgeries and has been on chronic opioid therapy since the 80s  Currently on 15 mg of Roxicodone every 4 hours and then Xtampza ER 13.5 mg 2 times daily for pain control  Left knee has small effusion is in varus he has medial joint line tenderness has decreased flexion and extension  X-ray shows varus knee with medial compartment disease  Discussed briefly possibility of knee replacement which would be done at tertiary care facility due to issues with pain control but he decided to go with an injection and will see him again in 6 weeks  Procedure note left knee injection   verbal consent was obtained to inject left knee joint  Timeout was completed to confirm the site of injection  The medications used were 40 mg of Depo-Medrol and 1% lidocaine 3 cc  Anesthesia was provided by ethyl chloride and the skin was prepped with alcohol.  After cleaning the skin with alcohol a 20-gauge needle was used to inject the left knee joint. There were no complications. A sterile bandage was applied.     Encounter Diagnosis  Name Primary?  . Chronic pain of left knee Yes

## 2019-12-16 ENCOUNTER — Ambulatory Visit: Payer: Medicare HMO | Admitting: Orthopedic Surgery

## 2019-12-16 ENCOUNTER — Encounter: Payer: Self-pay | Admitting: Orthopedic Surgery

## 2019-12-16 ENCOUNTER — Other Ambulatory Visit: Payer: Self-pay

## 2019-12-16 VITALS — BP 175/95 | HR 83 | Ht 66.0 in | Wt 179.0 lb

## 2019-12-16 DIAGNOSIS — M25562 Pain in left knee: Secondary | ICD-10-CM

## 2019-12-16 DIAGNOSIS — G8929 Other chronic pain: Secondary | ICD-10-CM

## 2019-12-16 NOTE — Patient Instructions (Signed)

## 2019-12-16 NOTE — Progress Notes (Addendum)
Chief Complaint  Patient presents with  . Knee Pain    Lt knee pain no better [facility] 11/13/18   Encounter Diagnosis  Name Primary?  . Chronic pain of left knee Yes   69 year old male with chronic pain currently on chronic opioid therapy has osteoarthritis and chronic pain in his left knee  Although he did not get good result from his other injection he  Wants to try another injection in left knee   Procedure note left knee injection   verbal consent was obtained to inject left knee joint  Timeout was completed to confirm the site of injection  The medications used were 40 mg of Depo-Medrol and 1% lidocaine 3 cc  Anesthesia was provided by ethyl chloride and the skin was prepped with alcohol.  After cleaning the skin with alcohol a 20-gauge needle was used to inject the left knee joint. There were no complications. A sterile bandage was applied.  Recommend 74-month follow-up for repeat injection if needed

## 2020-02-17 NOTE — Telephone Encounter (Signed)
error 

## 2020-03-09 ENCOUNTER — Encounter: Payer: Self-pay | Admitting: Orthopedic Surgery

## 2020-03-09 ENCOUNTER — Ambulatory Visit: Payer: Medicare HMO | Admitting: Orthopedic Surgery

## 2020-10-07 IMAGING — MR MRI OF THE LEFT KNEE WITHOUT CONTRAST
4 of 7 series · 16 of 40 positions shown · non-contrast
Comparison: Left knee x-rays dated July 28, 2018.

CLINICAL DATA: Severe left knee pain for the past 5 months. No
known injury.

EXAM:
MRI OF THE LEFT KNEE WITHOUT CONTRAST
TECHNIQUE: Multiplanar, multisequence MR imaging of the knee was performed. No
intravenous contrast was administered.

[Series 3: T2 fat-sat · axial · 4.0mm · 0.20mm/px · z∈[-40,+60]mm · 3 of 26 slices shown]
[im 6/26]
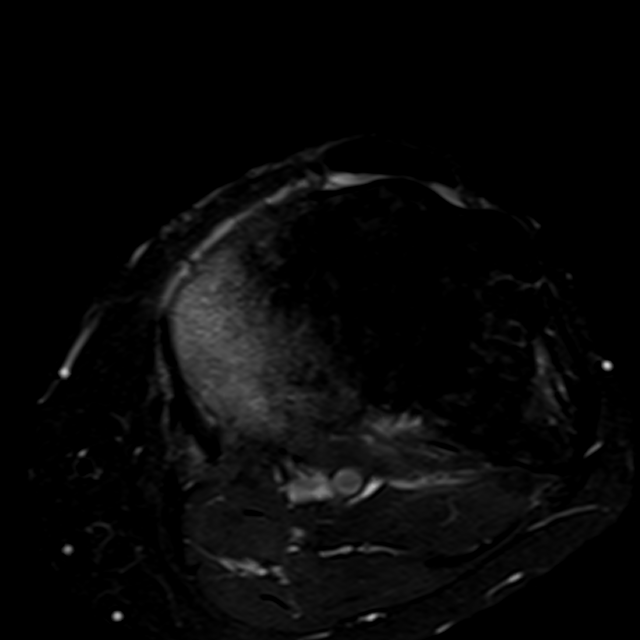
[im 16/26]
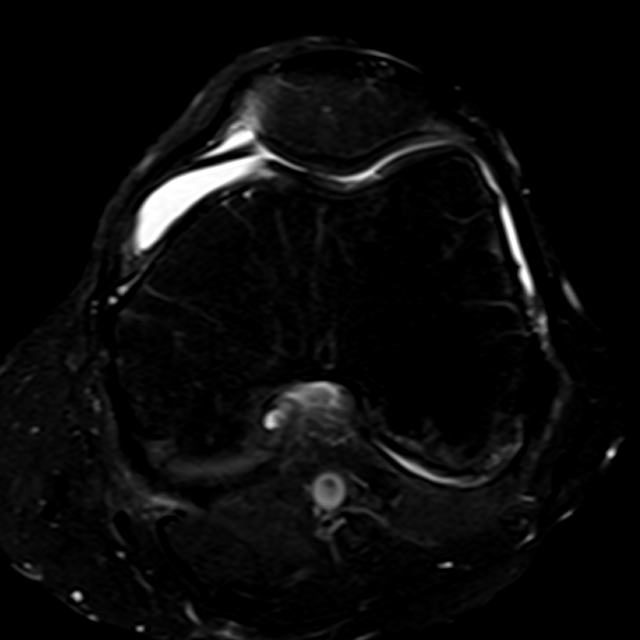
[im 26/26]
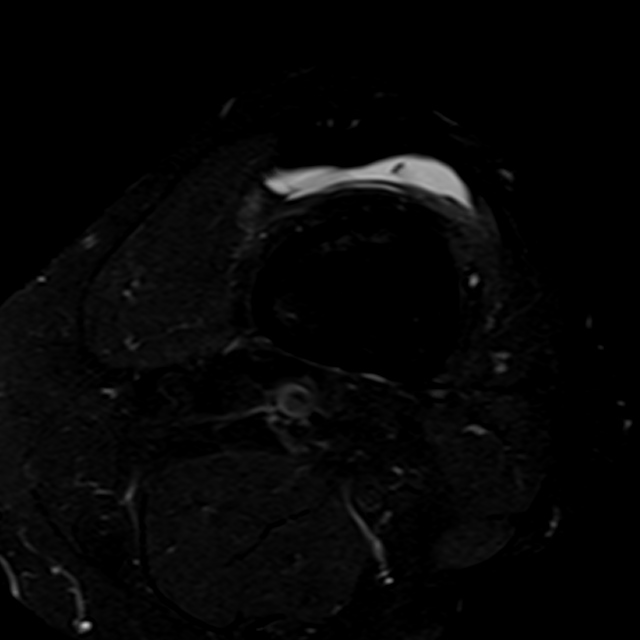

[Series 6: PD fat-sat · coronal · 3.0mm · 0.20mm/px · 7 of 32 slices shown (1 of 3)]
[im 1/32]
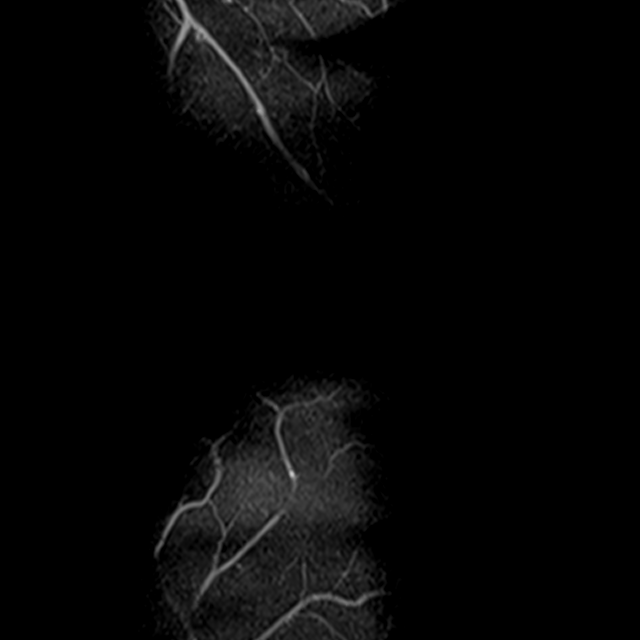
[im 6/32]
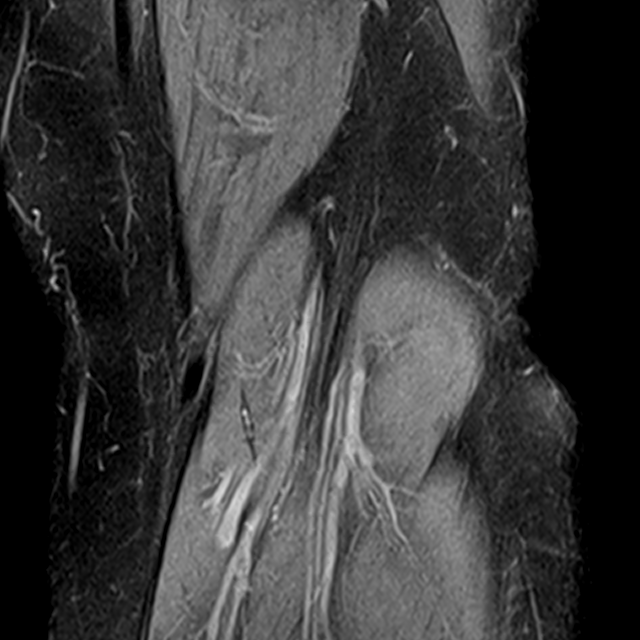
[im 11/32]
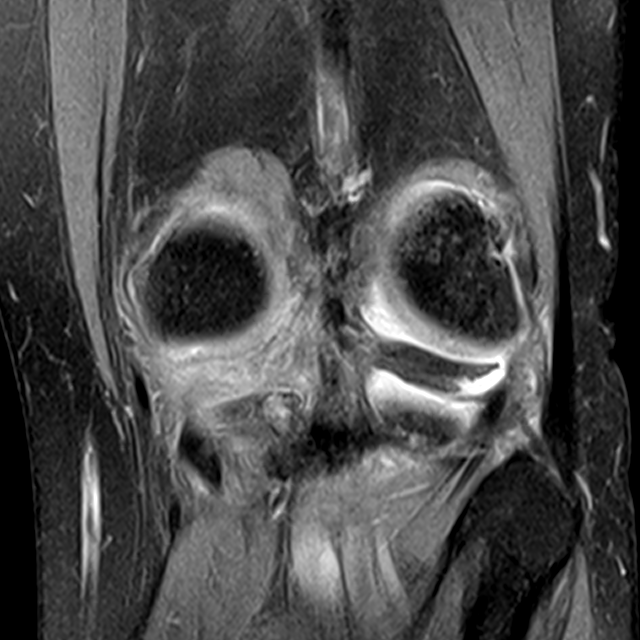
[im 16/32]
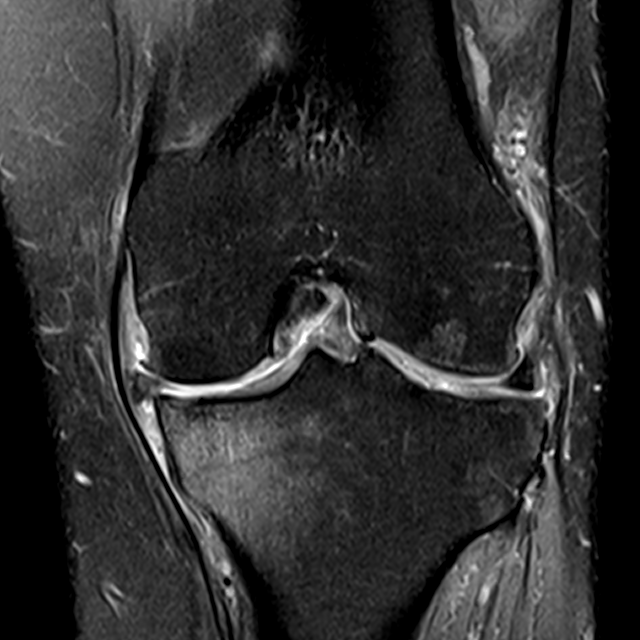
[im 21/32]
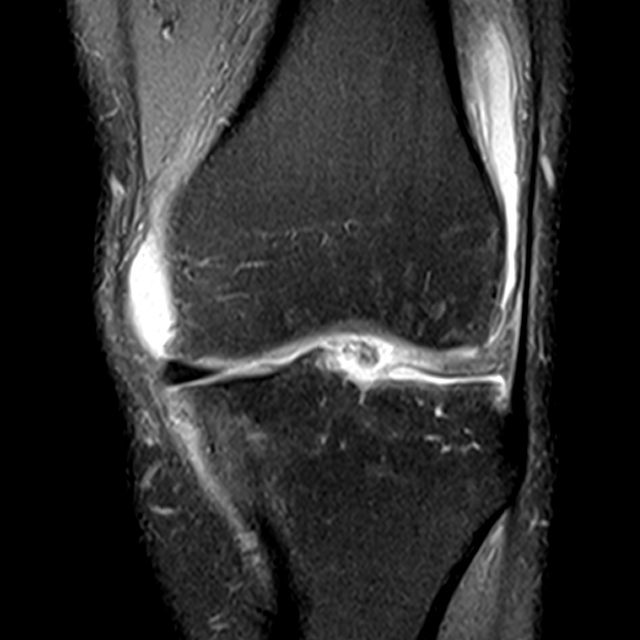
[im 26/32]
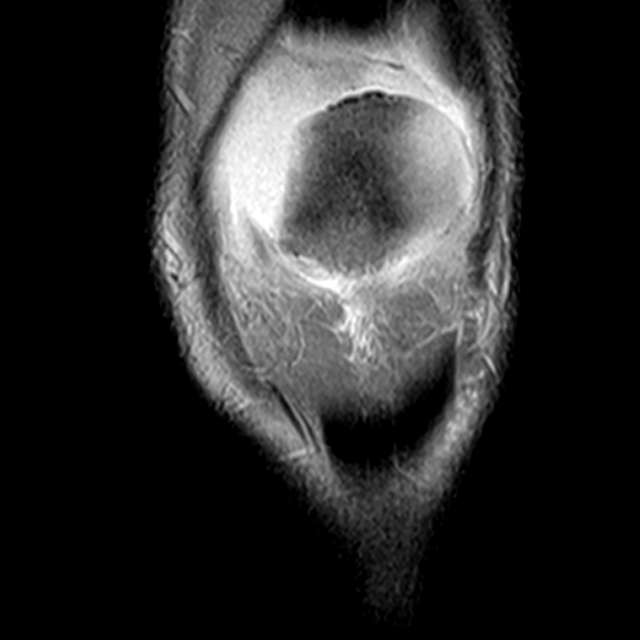
[im 32/32]
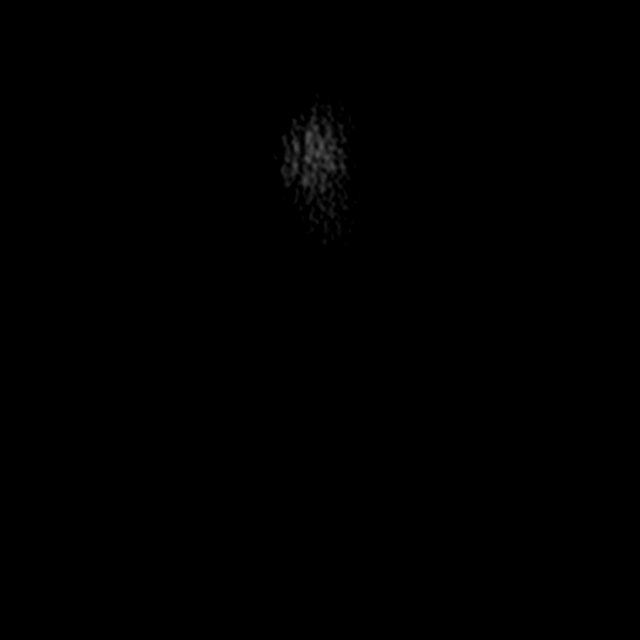

[Series 7: PD fat-sat · sagittal · 3.0mm · 0.41mm/px · 3 of 29 slices shown (2 of 3)]
[im 6/29]
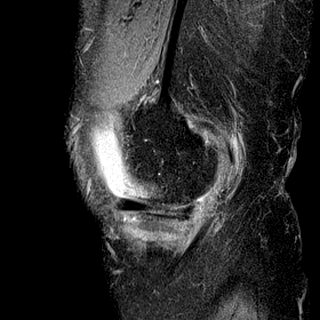
[im 17/29]
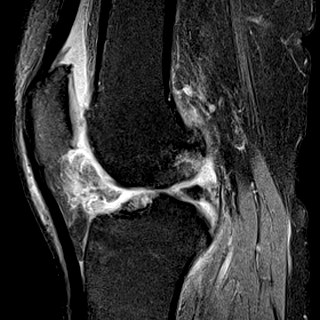
[im 29/29]
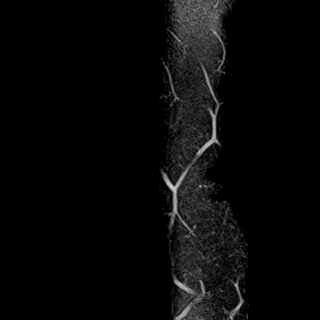

[Series 9: PD fat-sat · coronal · 2.0mm · 0.23mm/px · 3 of 15 slices shown (3 of 3)]
[im 1/15]
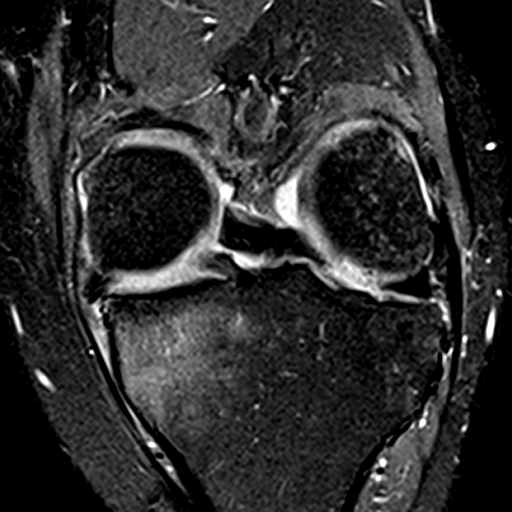
[im 8/15]
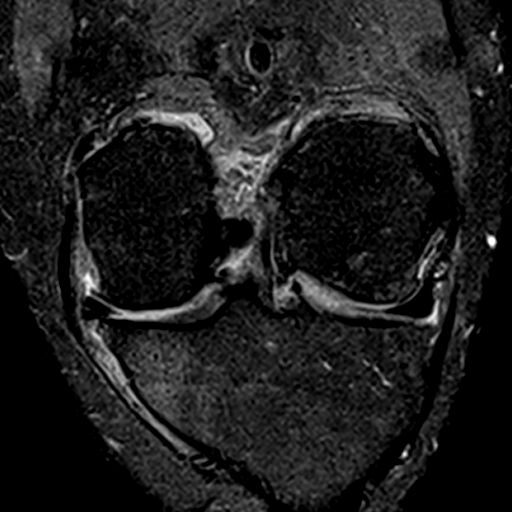
[im 15/15]
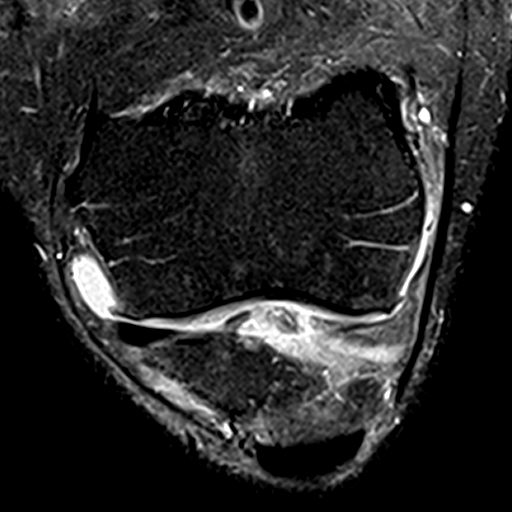

[16 of 40 positions shown; findings below may reference images not displayed]

FINDINGS: MENISCI

Medial meniscus: Near complete radial tear through the posterior
horn with mild extrusion of the body.

Lateral meniscus:  Radial tears of the anterior and posterior horns.

LIGAMENTS

Cruciates:  Intact ACL and PCL.

Collaterals: Medial collateral ligament is intact. Lateral
collateral ligament complex is intact.

CARTILAGE

Patellofemoral: High-grade partial-thickness cartilage loss over the
trochlear groove.

Medial: Large areas of near full-thickness cartilage loss over the
central weight-bearing medial femoral condyle and peripheral medial
tibial plateau.

Lateral: Partial-thickness cartilage loss and full-thickness
delamination over the posterior weight-bearing lateral femoral
condyle and central lateral tibial plateau.

Joint: Moderate joint effusion. Edema within Hoffa's fat. Borderline
thickened suprapatellar and medial plicae.

Popliteal Fossa: No significant Baker cyst. Intact popliteus tendon.

Extensor Mechanism: Intact quadriceps tendon and patellar tendon.
Intact medial and lateral patellar retinaculum. Intact MPFL.

Bones: Subchondral insufficiency fracture in the peripheral medial
tibial plateau with adjacent marrow edema. No dislocation. No
suspicious bone lesion.

Other: None.
IMPRESSION: 1. Near complete radial tear through the medial meniscus posterior
horn with mild extrusion of the body.
2. Radial tears of the lateral meniscus anterior and posterior
horns.
3. Subchondral insufficiency fracture of the medial tibial plateau.
4. Tricompartmental osteoarthritis, moderate in the medial
compartment.

## 2020-11-17 ENCOUNTER — Encounter (HOSPITAL_COMMUNITY): Payer: Self-pay

## 2020-11-17 ENCOUNTER — Other Ambulatory Visit: Payer: Self-pay

## 2020-11-18 ENCOUNTER — Inpatient Hospital Stay (HOSPITAL_COMMUNITY): Payer: Medicare Other

## 2020-11-18 ENCOUNTER — Inpatient Hospital Stay (HOSPITAL_COMMUNITY): Payer: Medicare Other | Admitting: Internal Medicine

## 2020-11-18 ENCOUNTER — Inpatient Hospital Stay (HOSPITAL_COMMUNITY): Payer: Medicare Other | Admitting: Radiology

## 2020-11-18 ENCOUNTER — Other Ambulatory Visit (HOSPITAL_COMMUNITY): Payer: Self-pay

## 2020-11-18 ENCOUNTER — Inpatient Hospital Stay
Admission: AD | Admit: 2020-11-18 | Discharge: 2020-12-19 | DRG: 393 | Disposition: E | Payer: Medicare Other | Source: Other Acute Inpatient Hospital | Attending: Pulmonary Disease | Admitting: Pulmonary Disease

## 2020-11-18 DIAGNOSIS — R34 Anuria and oliguria: Secondary | ICD-10-CM

## 2020-11-18 DIAGNOSIS — R4182 Altered mental status, unspecified: Secondary | ICD-10-CM

## 2020-11-18 DIAGNOSIS — Z9889 Other specified postprocedural states: Secondary | ICD-10-CM

## 2020-11-18 DIAGNOSIS — F1521 Other stimulant dependence, in remission: Secondary | ICD-10-CM | POA: Diagnosis present

## 2020-11-18 DIAGNOSIS — J9690 Respiratory failure, unspecified, unspecified whether with hypoxia or hypercapnia: Secondary | ICD-10-CM

## 2020-11-18 DIAGNOSIS — G9340 Encephalopathy, unspecified: Secondary | ICD-10-CM | POA: Diagnosis present

## 2020-11-18 DIAGNOSIS — F419 Anxiety disorder, unspecified: Secondary | ICD-10-CM | POA: Diagnosis present

## 2020-11-18 DIAGNOSIS — R188 Other ascites: Secondary | ICD-10-CM

## 2020-11-18 DIAGNOSIS — Z79899 Other long term (current) drug therapy: Secondary | ICD-10-CM

## 2020-11-18 DIAGNOSIS — R579 Shock, unspecified: Secondary | ICD-10-CM

## 2020-11-18 DIAGNOSIS — R000 Tachycardia, unspecified: Secondary | ICD-10-CM | POA: Diagnosis present

## 2020-11-18 DIAGNOSIS — J9602 Acute respiratory failure with hypercapnia: Secondary | ICD-10-CM | POA: Diagnosis present

## 2020-11-18 DIAGNOSIS — K922 Gastrointestinal hemorrhage, unspecified: Secondary | ICD-10-CM | POA: Diagnosis present

## 2020-11-18 DIAGNOSIS — E872 Acidosis, unspecified: Secondary | ICD-10-CM

## 2020-11-18 DIAGNOSIS — E874 Mixed disorder of acid-base balance: Secondary | ICD-10-CM | POA: Diagnosis present

## 2020-11-18 DIAGNOSIS — K6389 Other specified diseases of intestine: Principal | ICD-10-CM

## 2020-11-18 DIAGNOSIS — N179 Acute kidney failure, unspecified: Secondary | ICD-10-CM

## 2020-11-18 DIAGNOSIS — I330 Acute and subacute infective endocarditis: Secondary | ICD-10-CM

## 2020-11-18 DIAGNOSIS — E875 Hyperkalemia: Secondary | ICD-10-CM | POA: Diagnosis present

## 2020-11-18 DIAGNOSIS — K631 Perforation of intestine (nontraumatic): Secondary | ICD-10-CM | POA: Diagnosis present

## 2020-11-18 DIAGNOSIS — K559 Vascular disorder of intestine, unspecified: Secondary | ICD-10-CM | POA: Diagnosis present

## 2020-11-18 DIAGNOSIS — T68XXXA Hypothermia, initial encounter: Secondary | ICD-10-CM | POA: Diagnosis present

## 2020-11-18 DIAGNOSIS — R918 Other nonspecific abnormal finding of lung field: Secondary | ICD-10-CM

## 2020-11-18 DIAGNOSIS — D649 Anemia, unspecified: Secondary | ICD-10-CM | POA: Diagnosis present

## 2020-11-18 DIAGNOSIS — F1590 Other stimulant use, unspecified, uncomplicated: Secondary | ICD-10-CM

## 2020-11-18 DIAGNOSIS — N200 Calculus of kidney: Secondary | ICD-10-CM

## 2020-11-18 DIAGNOSIS — Z029 Encounter for administrative examinations, unspecified: Secondary | ICD-10-CM

## 2020-11-18 DIAGNOSIS — J9601 Acute respiratory failure with hypoxia: Secondary | ICD-10-CM | POA: Diagnosis present

## 2020-11-18 DIAGNOSIS — Z66 Do not resuscitate: Secondary | ICD-10-CM | POA: Diagnosis not present

## 2020-11-18 DIAGNOSIS — I319 Disease of pericardium, unspecified: Secondary | ICD-10-CM | POA: Diagnosis present

## 2020-11-18 DIAGNOSIS — Z4682 Encounter for fitting and adjustment of non-vascular catheter: Secondary | ICD-10-CM

## 2020-11-18 DIAGNOSIS — K7200 Acute and subacute hepatic failure without coma: Secondary | ICD-10-CM | POA: Diagnosis present

## 2020-11-18 DIAGNOSIS — K668 Other specified disorders of peritoneum: Secondary | ICD-10-CM

## 2020-11-18 LAB — TOTAL PROTEIN: PROTEIN TOTAL: 5.8 g/dL — ABNORMAL LOW (ref 6.0–8.0)

## 2020-11-18 LAB — MANUAL DIFF AND MORPHOLOGY-SYSMEX
BASOPHIL #: <0.1 10*3/uL (ref <=0.20)
BASOPHIL #: <0.1 10*3/uL (ref <=0.20)
BASOPHIL %: 0 %
BASOPHIL %: 0 %
EOSINOPHIL #: <0.1 10*3/uL (ref <=0.50)
EOSINOPHIL #: <0.1 10*3/uL (ref <=0.50)
EOSINOPHIL %: 1 %
EOSINOPHIL %: 0 %
LYMPHOCYTE #: 1 10*3/uL (ref 1.00–4.80)
LYMPHOCYTE #: 0.22 10*3/uL — ABNORMAL LOW (ref 1.00–4.80)
LYMPHOCYTE %: 11 %
LYMPHOCYTE %: 3 %
METAMYELOCYTE %: 4 %
MONOCYTE #: 0.36 10*3/uL (ref 0.20–1.10)
MONOCYTE #: 0.29 10*3/uL (ref 0.20–1.10)
MONOCYTE %: 4 %
MONOCYTE %: 4 %
MYELOCYTE %: 2 %
NEUTROPHIL #: 7.64 10*3/uL (ref 1.50–7.70)
NEUTROPHIL #: 6.35 10*3/uL (ref 1.50–7.70)
NEUTROPHIL %: 84 %
NEUTROPHIL %: 87 %
NRBC FROM MANUAL DIFF: 3 per 100 WBC
NRBC FROM MANUAL DIFF: 10 per 100 WBC

## 2020-11-18 LAB — URINALYSIS, MICROSCOPIC
GRANULAR CASTS: 14 /lpf — ABNORMAL HIGH (ref <=0)
HYALINE CASTS: 41 /lpf — ABNORMAL HIGH (ref <4.0)
RBCS: >182 /hpf — ABNORMAL HIGH (ref <6.0)
WBCS: 8 /hpf — ABNORMAL HIGH (ref <4.0)

## 2020-11-18 LAB — BASIC METABOLIC PANEL
ANION GAP: 26 mmol/L — ABNORMAL HIGH (ref 4–13)
ANION GAP: 23 mmol/L — ABNORMAL HIGH (ref 4–13)
ANION GAP: 23 mmol/L — ABNORMAL HIGH (ref 4–13)
BUN/CREA RATIO: 19 (ref 6–22)
BUN/CREA RATIO: 21 (ref 6–22)
BUN/CREA RATIO: 23 — ABNORMAL HIGH (ref 6–22)
BUN: 133 mg/dL — ABNORMAL HIGH (ref 8–25)
BUN: 126 mg/dL — ABNORMAL HIGH (ref 8–25)
BUN: 136 mg/dL — ABNORMAL HIGH (ref 8–25)
CALCIUM: 6.7 mg/dL — ABNORMAL LOW (ref 8.8–10.2)
CALCIUM: 8 mg/dL — ABNORMAL LOW (ref 8.8–10.2)
CALCIUM: 7.2 mg/dL — ABNORMAL LOW (ref 8.8–10.2)
CHLORIDE: 100 mmol/L (ref 96–111)
CHLORIDE: 100 mmol/L (ref 96–111)
CHLORIDE: 101 mmol/L (ref 96–111)
CO2 TOTAL: 13 mmol/L — ABNORMAL LOW (ref 23–31)
CO2 TOTAL: 21 mmol/L — ABNORMAL LOW (ref 23–31)
CO2 TOTAL: 22 mmol/L — ABNORMAL LOW (ref 23–31)
CREATININE: 7.04 mg/dL — ABNORMAL HIGH (ref 0.75–1.35)
CREATININE: 5.97 mg/dL — ABNORMAL HIGH (ref 0.75–1.35)
CREATININE: 5.79 mg/dL — ABNORMAL HIGH (ref 0.75–1.35)
ESTIMATED GFR: 8 mL/min/BSA — ABNORMAL LOW (ref >=60)
ESTIMATED GFR: 10 mL/min/BSA — ABNORMAL LOW (ref >=60)
ESTIMATED GFR: 10 mL/min/BSA — ABNORMAL LOW (ref >=60)
GLUCOSE: 74 mg/dL (ref 65–125)
GLUCOSE: 107 mg/dL (ref 65–125)
GLUCOSE: 100 mg/dL (ref 65–125)
POTASSIUM: 6 mmol/L (ref 3.5–5.1)
POTASSIUM: 5.3 mmol/L — ABNORMAL HIGH (ref 3.5–5.1)
POTASSIUM: 5.3 mmol/L — ABNORMAL HIGH (ref 3.5–5.1)
SODIUM: 139 mmol/L (ref 136–145)
SODIUM: 144 mmol/L (ref 136–145)
SODIUM: 146 mmol/L — ABNORMAL HIGH (ref 136–145)

## 2020-11-18 LAB — PHOSPHORUS
PHOSPHORUS: 16.3 mg/dL — ABNORMAL HIGH (ref 2.3–4.0)
PHOSPHORUS: 11.2 mg/dL — ABNORMAL HIGH (ref 2.3–4.0)
PHOSPHORUS: 10.7 mg/dL — ABNORMAL HIGH (ref 2.3–4.0)

## 2020-11-18 LAB — TEG, RAPID GLOBAL WITH LYSIS (TRAUMA)
LYS30 (%): 0 % (ref 0.0–2.6)
MA (CRT RAPID): 69.3 mm (ref 52.0–70.0)
MA FIBRINOGEN (CFF): 31 mm (ref 15.0–32.0)
R (CK): 8.7 min (ref 4.6–9.1)

## 2020-11-18 LAB — ARTERIAL BLOOD GAS WITH LACTATE REFLEX
%FIO2 (ARTERIAL): 100 %
%FIO2 (ARTERIAL): 100 %
%FIO2 (ARTERIAL): 100 %
BASE DEFICIT: 18 mmol/L — ABNORMAL HIGH (ref 0.0–3.0)
BASE DEFICIT: 5.2 mmol/L — ABNORMAL HIGH (ref 0.0–3.0)
BASE DEFICIT: 5.7 mmol/L — ABNORMAL HIGH (ref 0.0–3.0)
BICARBONATE (ARTERIAL): 10 mmol/L — ABNORMAL LOW (ref 18.0–26.0)
BICARBONATE (ARTERIAL): 20.7 mmol/L (ref 18.0–26.0)
BICARBONATE (ARTERIAL): 20.3 mmol/L (ref 18.0–26.0)
LACTATE: 5.5 mmol/L — ABNORMAL HIGH (ref 0.0–1.3)
LACTATE: 7.1 mmol/L — ABNORMAL HIGH (ref 0.0–1.3)
LACTATE: 7.8 mmol/L — ABNORMAL HIGH (ref 0.0–1.3)
PAO2/FIO2 RATIO: 57 (ref <=200)
PAO2/FIO2 RATIO: 67 (ref <=200)
PAO2/FIO2 RATIO: 67 (ref <=200)
PCO2 (ARTERIAL): 76 mm/Hg (ref 35–45)
PCO2 (ARTERIAL): 51 mm/Hg — ABNORMAL HIGH (ref 35–45)
PCO2 (ARTERIAL): 48 mm/Hg — ABNORMAL HIGH (ref 35–45)
PH (ARTERIAL): 6.92 — CL (ref 7.35–7.45)
PH (ARTERIAL): 7.25 — ABNORMAL LOW (ref 7.35–7.45)
PH (ARTERIAL): 7.26 — ABNORMAL LOW (ref 7.35–7.45)
PO2 (ARTERIAL): 57 mm/Hg — ABNORMAL LOW (ref 72–100)
PO2 (ARTERIAL): 67 mm/Hg — ABNORMAL LOW (ref 72–100)
PO2 (ARTERIAL): 67 mm/Hg — ABNORMAL LOW (ref 72–100)

## 2020-11-18 LAB — HEPATITIS B SURFACE ANTIBODY: HBV SURFACE ANTIBODY QUANTITATIVE: 35 m[IU]/mL — ABNORMAL HIGH (ref <8)

## 2020-11-18 LAB — CBC WITH DIFF
HCT: 37.8 % — ABNORMAL LOW (ref 38.9–52.0)
HCT: 31.4 % — ABNORMAL LOW (ref 38.9–52.0)
HGB: 12.7 g/dL — ABNORMAL LOW (ref 13.4–17.5)
HGB: 11.1 g/dL — ABNORMAL LOW (ref 13.4–17.5)
MCH: 30.1 pg (ref 26.0–32.0)
MCH: 29.4 pg (ref 26.0–32.0)
MCHC: 33.6 g/dL (ref 31.0–35.5)
MCHC: 35.4 g/dL (ref 31.0–35.5)
MCV: 89.6 fL (ref 78.0–100.0)
MCV: 83.3 fL (ref 78.0–100.0)
MPV: 11.4 fL (ref 8.7–12.5)
MPV: 11.8 fL (ref 8.7–12.5)
PLATELETS: 255 10*3/uL (ref 150–400)
PLATELETS: 176 10*3/uL (ref 150–400)
RBC: 4.22 10*6/uL — ABNORMAL LOW (ref 4.50–6.10)
RBC: 3.77 10*6/uL — ABNORMAL LOW (ref 4.50–6.10)
RDW-CV: 15.1 % (ref 11.5–15.5)
RDW-CV: 15 % (ref 11.5–15.5)
WBC: 9.1 10*3/uL (ref 3.7–11.0)
WBC: 7.3 10*3/uL (ref 3.7–11.0)

## 2020-11-18 LAB — TYPE AND SCREEN: ABO/RH(D): O POS

## 2020-11-18 LAB — PT/INR
INR: 1.45 — ABNORMAL HIGH (ref 0.80–1.20)
PROTHROMBIN TIME: 16.8 seconds — ABNORMAL HIGH (ref 9.1–13.9)

## 2020-11-18 LAB — LACTIC ACID TIMED
LACTIC ACID: 6.3 mmol/L (ref 0.5–2.2)
LACTIC ACID: 9.5 mmol/L (ref 0.5–2.2)

## 2020-11-18 LAB — URINALYSIS, MACROSCOPIC
BILIRUBIN: NEGATIVE mg/dL
COLOR: NORMAL
GLUCOSE: NEGATIVE mg/dL
KETONES: NEGATIVE mg/dL
LEUKOCYTES: NEGATIVE WBCs/uL
NITRITE: NEGATIVE
PH: 5 (ref 5.0–8.0)
PROTEIN: 100 mg/dL — AB
SPECIFIC GRAVITY: 1.019 (ref 1.005–1.030)
UROBILINOGEN: NEGATIVE mg/dL

## 2020-11-18 LAB — PRODUCT: FFP/PLASMA - UNITS: UNIT DIVISION: 0

## 2020-11-18 LAB — DRUG SCREEN, WITH CONFIRMATION, URINE
AMPHETAMINES, URINE: POSITIVE — AB
BARBITURATES URINE: NEGATIVE
BENZODIAZEPINES URINE: NEGATIVE
BUPRENORPHINE URINE: POSITIVE — AB
CANNABINOIDS URINE: NEGATIVE
COCAINE METABOLITES URINE: NEGATIVE
CREATININE RANDOM URINE: 118 mg/dL — ABNORMAL HIGH (ref 50–100)
ECSTASY/MDMA URINE: NEGATIVE
FENTANYL, RANDOM URINE: NEGATIVE
METHADONE URINE: NEGATIVE
OPIATES URINE (LOW CUTOFF): POSITIVE — AB
OXYCODONE URINE: POSITIVE — AB

## 2020-11-18 LAB — PATH COMMENT: PATHOLOGIST INTERPRETATION: ABNORMAL — AB

## 2020-11-18 LAB — VENOUS BLOOD GAS WITH LACTATE REFLEX
%FIO2 (VENOUS): 100 %
BASE DEFICIT: 20.3 mmol/L — ABNORMAL HIGH (ref <=3.0)
BICARBONATE (VENOUS): 8.4 mmol/L — ABNORMAL LOW (ref 22.0–26.0)
LACTATE: 7.1 mmol/L — ABNORMAL HIGH (ref 0.0–1.3)
PCO2 (VENOUS): 77 mm/Hg (ref 41–51)
PH (VENOUS): 6.87 — CL (ref 7.31–7.41)
PO2 (VENOUS): 65 mm/Hg — ABNORMAL HIGH (ref 35–50)

## 2020-11-18 LAB — HEPATITIS C VIRUS (HCV) RNA DETECTION AND QUANTIFICATION, PCR, PLASMA
HCV QUANTITATIVE PCR: 3640 IU/ML — ABNORMAL HIGH
HCV QUANTITATIVE RNA LOG: 3.56 LOG10 — ABNORMAL HIGH

## 2020-11-18 LAB — AMMONIA: AMMONIA: 84 umol/L — ABNORMAL HIGH (ref 15–50)

## 2020-11-18 LAB — CROSSMATCH RED CELLS - UNITS: UNIT DIVISION: 0

## 2020-11-18 LAB — BILIRUBIN TOTAL: BILIRUBIN TOTAL: 1.9 mg/dL — ABNORMAL HIGH (ref 0.3–1.3)

## 2020-11-18 LAB — POC BLOOD GLUCOSE (RESULTS)
GLUCOSE, POC: 132 mg/dl — ABNORMAL HIGH (ref 70–105)
GLUCOSE, POC: 106 mg/dl — ABNORMAL HIGH (ref 70–105)

## 2020-11-18 LAB — AST (SGOT): AST (SGOT): 1474 U/L — ABNORMAL HIGH (ref 8–45)

## 2020-11-18 LAB — THYROXINE, FREE (FREE T4): THYROXINE (T4), FREE: 0.7 ng/dL (ref 0.70–1.25)

## 2020-11-18 LAB — H & H
HCT: 36.5 % — ABNORMAL LOW (ref 38.9–52.0)
HCT: 30.9 % — ABNORMAL LOW (ref 38.9–52.0)
HGB: 11.9 g/dL — ABNORMAL LOW (ref 13.4–17.5)
HGB: 10.6 g/dL — ABNORMAL LOW (ref 13.4–17.5)

## 2020-11-18 LAB — MAGNESIUM
MAGNESIUM: 3 mg/dL — ABNORMAL HIGH (ref 1.8–2.6)
MAGNESIUM: 2.6 mg/dL (ref 1.8–2.6)
MAGNESIUM: 2.5 mg/dL (ref 1.8–2.6)

## 2020-11-18 LAB — ALT (SGPT): ALT (SGPT): 468 U/L — ABNORMAL HIGH (ref 10–55)

## 2020-11-18 LAB — ALK PHOS (ALKALINE PHOSPHATASE): ALKALINE PHOSPHATASE: 113 U/L (ref 45–115)

## 2020-11-18 LAB — TROPONIN-I: TROPONIN I: 48 ng/L — ABNORMAL HIGH (ref 0–30)

## 2020-11-18 LAB — HIV1/HIV2 SCREEN, COMBINED ANTIGEN AND ANTIBODY: HIV SCREEN, COMBINED ANTIGEN & ANTIBODY: NEGATIVE

## 2020-11-18 LAB — HEPATITIS C ANTIBODY SCREEN WITH REFLEX TO HCV PCR: HCV ANTIBODY QUALITATIVE: REACTIVE — AB

## 2020-11-18 LAB — HEPATITIS B SURFACE ANTIGEN: HBV SURFACE ANTIGEN QUALITATIVE: NEGATIVE

## 2020-11-18 LAB — ABO & RH: ABO/RH(D): O POS

## 2020-11-18 LAB — THYROID STIMULATING HORMONE WITH FREE T4 REFLEX: TSH: 0.027 u[IU]/mL — ABNORMAL LOW (ref 0.430–3.550)

## 2020-11-18 LAB — PTT (PARTIAL THROMBOPLASTIN TIME): APTT: 33.1 seconds (ref 24.2–37.5)

## 2020-11-18 LAB — GAMMA GT: GGT: 30 U/L (ref 7–50)

## 2020-11-18 MED ORDER — ELECTROLYTE-A INTRAVENOUS SOLUTION BOLUS
1000.0000 mL | Freq: Once | INTRAVENOUS | Status: AC
Start: 2020-11-18 — End: 2020-11-18
  Administered 2020-11-18: 0 mL via INTRAVENOUS
  Administered 2020-11-18: 1000 mL via INTRAVENOUS

## 2020-11-18 MED ORDER — VASOPRESSIN 20 UNITS IN D5W 100 ML PREMIX INFUSION - GI
0.1000 [IU]/min | INTRAVENOUS | Status: DC
Start: 2020-11-18 — End: 2020-11-18
  Administered 2020-11-18 (×7): 0.1 [IU]/min via INTRAVENOUS
  Administered 2020-11-18: 0 [IU]/min via INTRAVENOUS
  Filled 2020-11-18 (×4): qty 100

## 2020-11-18 MED ORDER — SODIUM CHLORIDE 0.9 % IV BOLUS
40.0000 mL | INJECTION | Freq: Once | Status: AC | PRN
Start: 2020-11-18 — End: 2020-11-18

## 2020-11-18 MED ORDER — CALCIUM CHLORIDE 100 MG/ML (10 %) INTRAVENOUS SYRINGE
1000.0000 mg | INJECTION | Freq: Once | INTRAVENOUS | Status: AC
Start: 2020-11-18 — End: 2020-11-18

## 2020-11-18 MED ORDER — PANTOPRAZOLE 40 MG TABLET,DELAYED RELEASE
40.0000 mg | DELAYED_RELEASE_TABLET | Freq: Two times a day (BID) | ORAL | Status: DC
Start: 2020-11-21 — End: 2020-11-18

## 2020-11-18 MED ORDER — VANCOMYCIN IV - PHARMACIST TO DOSE PER PROTOCOL - NO EXCLUSION CRITERIA
Freq: Every day | Status: DC | PRN
Start: 2020-11-18 — End: 2020-11-18

## 2020-11-18 MED ORDER — SODIUM BICARBONATE 1 MEQ/ML (8.4 %) INTRAVENOUS SOLUTION
50.0000 meq | INTRAVENOUS | Status: AC
Start: 2020-11-18 — End: 2020-11-18

## 2020-11-18 MED ORDER — SODIUM BICARBONATE 1 MEQ/ML (8.4 %) INTRAVENOUS SOLUTION
50.0000 meq | Freq: Once | INTRAVENOUS | Status: AC
Start: 2020-11-18 — End: 2020-11-18

## 2020-11-18 MED ORDER — SODIUM CHLORIDE 0.9 % INTRAVENOUS PIGGYBACK
2.0000 g | INJECTION | INTRAVENOUS | Status: AC
Start: 2020-11-18 — End: 2020-11-18
  Administered 2020-11-18: 2 g via INTRAVENOUS
  Administered 2020-11-18: 0 g via INTRAVENOUS
  Administered 2020-11-18: 03:00:00 2 g via INTRAVENOUS
  Filled 2020-11-18: qty 12.5

## 2020-11-18 MED ORDER — NOREPINEPHRINE BITARTRATE 16 MG/250 ML (64 MCG/ML) IN 0.9 % NACL IV
0.0300 ug/kg/min | INTRAVENOUS | Status: DC
Start: 2020-11-18 — End: 2020-11-18
  Administered 2020-11-18: 0.3 ug/kg/min via INTRAVENOUS
  Administered 2020-11-18: 0 ug/kg/min via INTRAVENOUS
  Administered 2020-11-18 (×2): 0.3 ug/kg/min via INTRAVENOUS
  Filled 2020-11-18: qty 250

## 2020-11-18 MED ORDER — CALCIUM CHLORIDE 100 MG/ML (10 %) INTRAVENOUS SYRINGE
INJECTION | INTRAVENOUS | Status: AC
Start: 2020-11-18 — End: 2020-11-18
  Administered 2020-11-18: 1000 mg via INTRAVENOUS
  Filled 2020-11-18: qty 10

## 2020-11-18 MED ORDER — SODIUM CHLORIDE 0.9 % INTRAVENOUS SOLUTION
80.0000 mg | INTRAVENOUS | Status: AC
Start: 2020-11-18 — End: 2020-11-18
  Administered 2020-11-18 (×2): 80 mg via INTRAVENOUS
  Administered 2020-11-18: 0 mg via INTRAVENOUS
  Filled 2020-11-18: qty 20

## 2020-11-18 MED ORDER — VANCOMYCIN 10 GRAM INTRAVENOUS SOLUTION
1250.0000 mg | Freq: Once | INTRAVENOUS | Status: AC
Start: 2020-11-18 — End: 2020-11-18
  Administered 2020-11-18: 1250 mg via INTRAVENOUS
  Administered 2020-11-18: 0 mg via INTRAVENOUS
  Filled 2020-11-18: qty 12.5

## 2020-11-18 MED ORDER — SODIUM BICARBONATE 1 MEQ/ML (8.4 %) INTRAVENOUS SOLUTION
100.0000 meq | Freq: Once | INTRAVENOUS | Status: AC
Start: 2020-11-18 — End: 2020-11-18
  Administered 2020-11-18 (×2): 100 meq via INTRAVENOUS
  Filled 2020-11-18: qty 100

## 2020-11-18 MED ORDER — DEXTROSE 5 % IN WATER (D5W) INTRAVENOUS SOLUTION
INTRAVENOUS | Status: DC
Start: 2020-11-18 — End: 2020-11-18

## 2020-11-18 MED ORDER — INSULIN U-100 REGULAR HUMAN 100 UNIT/ML INJECTION SOLUTION
10.0000 [IU] | Freq: Once | INTRAMUSCULAR | Status: AC
Start: 2020-11-18 — End: 2020-11-18
  Administered 2020-11-18: 10 [IU] via INTRAVENOUS

## 2020-11-18 MED ORDER — INSULIN REGULAR HUMAN 100 UNIT/ML INJECTION - CHARGE BY DOSE
5.0000 [IU] | INTRAMUSCULAR | Status: DC
Start: 2020-11-18 — End: 2020-11-18

## 2020-11-18 MED ORDER — SODIUM CHLORIDE 0.9 % (FLUSH) INJECTION SYRINGE
2.0000 mL | INJECTION | INTRAMUSCULAR | Status: DC | PRN
Start: 2020-11-18 — End: 2020-11-18

## 2020-11-18 MED ORDER — VANCOMYCIN IV - INTERMITTENT DOSING
Freq: Every day | Status: DC | PRN
Start: 2020-11-18 — End: 2020-11-18

## 2020-11-18 MED ORDER — SODIUM CHLORIDE 0.9 % INTRAVENOUS SOLUTION
1.0000 g | INTRAVENOUS | Status: DC
Start: 2020-11-19 — End: 2020-11-18

## 2020-11-18 MED ORDER — SODIUM CHLORIDE 0.9 % (FLUSH) INJECTION SYRINGE
2.0000 mL | INJECTION | Freq: Three times a day (TID) | INTRAMUSCULAR | Status: DC
Start: 2020-11-18 — End: 2020-11-18
  Administered 2020-11-18: 0 mL
  Administered 2020-11-18: 6 mL
  Administered 2020-11-18: 5 mL

## 2020-11-18 MED ORDER — CALCIUM CHLORIDE 100 MG/ML (10 %) INTRAVENOUS SYRINGE
1000.0000 mg | INJECTION | Freq: Once | INTRAVENOUS | Status: DC
Start: 2020-11-18 — End: 2020-11-18

## 2020-11-18 MED ORDER — CALCIUM CHLORIDE 100 MG/ML (10 %) INTRAVENOUS SYRINGE
INJECTION | INTRAVENOUS | Status: DC
Start: 2020-11-18 — End: 2020-11-18
  Filled 2020-11-18: qty 10

## 2020-11-18 MED ORDER — SODIUM BICARBONATE 1 MEQ/ML (8.4 %) INTRAVENOUS SOLUTION
INTRAVENOUS | Status: DC
Start: 2020-11-18 — End: 2020-11-18
  Filled 2020-11-18: qty 50

## 2020-11-18 MED ORDER — SODIUM BICARBONATE 1 MEQ/ML (8.4 %) INTRAVENOUS SOLUTION
INTRAVENOUS | Status: AC
Start: 2020-11-18 — End: 2020-11-18
  Administered 2020-11-18 (×2): 50 meq via INTRAVENOUS
  Filled 2020-11-18: qty 50

## 2020-11-18 MED ORDER — PHENYLEPHRINE 10 MG/ML INJECTION SOLUTION
INTRAMUSCULAR | Status: DC
Start: 2020-11-18 — End: 2020-11-18
  Filled 2020-11-18: qty 5

## 2020-11-18 MED ORDER — EPINEPHRINE 1 MG/ML INJECTION SOLUTION
INTRAMUSCULAR | Status: DC
Start: 2020-11-18 — End: 2020-11-18
  Filled 2020-11-18: qty 30

## 2020-11-18 MED ORDER — NOREPINEPHRINE BITARTRATE 16 MG/250 ML (64 MCG/ML) IN 0.9 % NACL IV
INTRAVENOUS | Status: AC
Start: 2020-11-18 — End: 2020-11-18
  Administered 2020-11-18: 0.3 ug/kg/min via INTRAVENOUS
  Filled 2020-11-18: qty 250

## 2020-11-18 MED ORDER — SODIUM BICARBONATE 1 MEQ/ML (8.4 %) INTRAVENOUS SOLUTION
50.0000 meq | Freq: Once | INTRAVENOUS | Status: DC
Start: 2020-11-18 — End: 2020-11-18

## 2020-11-18 MED ORDER — LORAZEPAM 2 MG/ML INJECTION SOLUTION
2.0000 mg | Freq: Once | INTRAMUSCULAR | Status: AC
Start: 2020-11-18 — End: 2020-11-18
  Administered 2020-11-18: 2 mg via INTRAVENOUS
  Filled 2020-11-18: qty 1

## 2020-11-18 MED ORDER — METRONIDAZOLE 500 MG/100 ML IN SODIUM CHLOR(ISO) INTRAVENOUS PIGGYBACK
500.0000 mg | INJECTION | Freq: Two times a day (BID) | INTRAVENOUS | Status: DC
Start: 2020-11-18 — End: 2020-11-18
  Administered 2020-11-18: 03:00:00 0 mg via INTRAVENOUS
  Administered 2020-11-18: 02:00:00 500 mg via INTRAVENOUS
  Filled 2020-11-18: qty 100

## 2020-11-18 MED ORDER — PANTOPRAZOLE 40 MG INTRAVENOUS SOLUTION
40.0000 mg | Freq: Two times a day (BID) | INTRAVENOUS | Status: DC
Start: 2020-11-18 — End: 2020-11-18

## 2020-11-18 MED ORDER — SODIUM BICARBONATE 1 MEQ/ML (8.4 %) INTRAVENOUS SOLUTION
100.0000 meq | Freq: Once | INTRAVENOUS | Status: AC
Start: 2020-11-18 — End: 2020-11-18

## 2020-11-18 MED ORDER — SODIUM CHLORIDE 0.9 % INTRAVENOUS SOLUTION
0.0200 ug/kg/min | INTRAVENOUS | Status: DC
Start: 2020-11-18 — End: 2020-11-18
  Administered 2020-11-18 (×2): 0.3 ug/kg/min via INTRAVENOUS
  Administered 2020-11-18: 0 ug/kg/min via INTRAVENOUS
  Administered 2020-11-18 (×3): 0.3 ug/kg/min via INTRAVENOUS
  Filled 2020-11-18: qty 15

## 2020-11-18 MED ORDER — PANTOPRAZOLE 40 MG TABLET,DELAYED RELEASE
40.0000 mg | DELAYED_RELEASE_TABLET | Freq: Two times a day (BID) | ORAL | Status: DC
Start: 2020-11-20 — End: 2020-11-18

## 2020-11-18 MED ORDER — DEXTROSE 5 % IN WATER (D5W) INTRAVENOUS SOLUTION
INTRAVENOUS | Status: DC
Start: 2020-11-18 — End: 2020-11-18
  Filled 2020-11-18: qty 1000

## 2020-11-18 MED ORDER — SODIUM BICARBONATE 1 MEQ/ML (8.4 %) INTRAVENOUS SOLUTION
INTRAVENOUS | Status: AC
Start: 2020-11-18 — End: 2020-11-18
  Administered 2020-11-18: 50 meq via INTRAVENOUS
  Filled 2020-11-18: qty 50

## 2020-11-18 MED ORDER — GLYCOPYRROLATE 0.2 MG/ML INJECTION SOLUTION
200.0000 ug | Freq: Three times a day (TID) | INTRAMUSCULAR | Status: DC
Start: 2020-11-18 — End: 2020-11-18
  Administered 2020-11-18 (×2): 200 ug via INTRAVENOUS
  Filled 2020-11-18: qty 1

## 2020-11-18 MED ORDER — SODIUM BICARBONATE 1 MEQ/ML (8.4 %) INTRAVENOUS SOLUTION
INTRAVENOUS | Status: AC
Start: 2020-11-18 — End: 2020-11-18
  Administered 2020-11-18: 100 meq via INTRAVENOUS
  Filled 2020-11-18: qty 50

## 2020-11-18 MED ORDER — SODIUM CHLORIDE 0.9 % INTRAVENOUS SOLUTION
1.0000 g | INTRAVENOUS | Status: AC
Start: 2020-11-18 — End: 2020-11-18
  Administered 2020-11-18: 0 g via INTRAVENOUS
  Administered 2020-11-18: 1 g via INTRAVENOUS

## 2020-11-18 MED ORDER — SODIUM BICARBONATE 1 MEQ/ML (8.4 %) INTRAVENOUS SOLUTION
50.0000 meq | INTRAVENOUS | Status: AC
Start: 2020-11-18 — End: 2020-11-18
  Administered 2020-11-18: 50 meq via INTRAVENOUS
  Filled 2020-11-18 (×2): qty 50

## 2020-11-18 MED ORDER — PHENYLEPHRINE 50 MG/250 ML (200 MCG/ML) IN 0.9 % SODIUM CHLORIDE IV
0.5000 ug/kg/min | INTRAVENOUS | Status: DC
Start: 2020-11-18 — End: 2020-11-18
  Administered 2020-11-18: 0 ug/kg/min via INTRAVENOUS
  Administered 2020-11-18 (×4): 5 ug/kg/min via INTRAVENOUS
  Administered 2020-11-18: 2 ug/kg/min via INTRAVENOUS
  Administered 2020-11-18 (×2): 5 ug/kg/min via INTRAVENOUS
  Filled 2020-11-18 (×4): qty 250

## 2020-11-18 MED ORDER — FENTANYL (PF) 50 MCG/ML INJECTION SOLUTION
100.0000 ug | INTRAMUSCULAR | Status: AC
Start: 2020-11-18 — End: 2020-11-18
  Administered 2020-11-18 (×2): 100 ug via INTRAVENOUS
  Filled 2020-11-18: qty 2

## 2020-11-18 MED ORDER — CALCIUM CHLORIDE 100 MG/ML (10 %) INTRAVENOUS SYRINGE
1000.0000 mg | INJECTION | Freq: Once | INTRAVENOUS | Status: AC
Start: 2020-11-18 — End: 2020-11-18
  Administered 2020-11-18: 1000 mg via INTRAVENOUS
  Filled 2020-11-18: qty 10

## 2020-11-18 MED ORDER — LORAZEPAM 2 MG/ML INJECTION SOLUTION
1.0000 mg | Freq: Once | INTRAMUSCULAR | Status: DC
Start: 2020-11-18 — End: 2020-11-18

## 2020-11-18 MED ORDER — DEXTROSE 50 % IN WATER (D50W) INTRAVENOUS SYRINGE
50.0000 g | INJECTION | INTRAVENOUS | Status: AC
Start: 2020-11-18 — End: 2020-11-18
  Administered 2020-11-18: 100 mL via INTRAVENOUS
  Filled 2020-11-18: qty 50

## 2020-11-18 MED ORDER — SODIUM BICARBONATE 1 MEQ/ML (8.4 %) INTRAVENOUS SOLUTION
50.0000 meq | Freq: Once | INTRAVENOUS | Status: AC
Start: 2020-11-18 — End: 2020-11-18
  Administered 2020-11-18 (×2): 50 meq via INTRAVENOUS
  Filled 2020-11-18: qty 50

## 2020-11-18 MED ORDER — SODIUM CHLORIDE 0.9 % INJECTION SOLUTION
2.0000 mL | INTRAVENOUS | Status: DC
Start: 2020-11-18 — End: 2020-11-18

## 2020-11-18 MED ORDER — HYDROCORTISONE SOD SUCCINATE 100 MG/2 ML VIAL WRAPPER
50.0000 mg | Freq: Four times a day (QID) | INTRAMUSCULAR | Status: DC
Start: 2020-11-18 — End: 2020-11-18
  Administered 2020-11-18: 50 mg via INTRAVENOUS
  Filled 2020-11-18: qty 2

## 2020-11-18 MED ORDER — SODIUM BICARBONATE 1 MEQ/ML (8.4 %) INTRAVENOUS SOLUTION
INTRAVENOUS | Status: AC
Start: 2020-11-18 — End: 2020-11-18
  Administered 2020-11-18 (×2): 50 meq via INTRAVENOUS
  Filled 2020-11-18: qty 50

## 2020-11-18 MED ORDER — NOREPINEPHRINE BITARTRATE 16 MG/250 ML (64 MCG/ML) IN 0.9 % NACL IV
0.0300 ug/kg/min | INTRAVENOUS | Status: DC
Start: 2020-11-18 — End: 2020-11-18
  Administered 2020-11-18: 0 ug/kg/min via INTRAVENOUS

## 2020-11-19 LAB — PRODUCT: FFP/PLASMA - UNITS: UNIT DIVISION: 0

## 2020-11-19 LAB — ADULT ROUTINE BLOOD CULTURE, SET OF 2 BOTTLES (BACTERIA AND YEAST)
BLOOD CULTURE, ROUTINE: ABNORMAL — CR
BLOOD CULTURE, ROUTINE: ABNORMAL — CR

## 2020-11-20 LAB — ADULT ROUTINE BLOOD CULTURE, SET OF 2 BOTTLES (BACTERIA AND YEAST)

## 2020-11-21 LAB — ADULT ROUTINE BLOOD CULTURE, SET OF 2 BOTTLES (BACTERIA AND YEAST)

## 2020-11-22 LAB — OPIATES CONFIRMATORY/DEFINITIVE, URINE, BY LC-MS/MS (PERFORMABLE)
CODEINE: NOT DETECTED ng/mL (ref <25)
DIHYDROCODEINE: NOT DETECTED ng/mL (ref <25)
HYDROCODONE: NOT DETECTED ng/mL (ref <25)
HYDROMORPHONE: NOT DETECTED ng/mL (ref <25)
MORPHINE: NOT DETECTED ng/mL (ref <25)
NORHYDROCODONE: NOT DETECTED ng/mL (ref <25)
NOROXYCODONE: >1000 ng/mL — ABNORMAL HIGH (ref <25)
OXYCODONE: >1000 ng/mL — ABNORMAL HIGH (ref <25)
OXYMORPHONE: >1000 ng/mL — ABNORMAL HIGH (ref <25)

## 2020-11-22 LAB — DRUG MONITOR,AMPHETAMINE, QN, URINE: METHAMPHETAMINE: 6400 ng/mL — ABNORMAL HIGH (ref <250)

## 2020-11-22 LAB — SUBOXONE CONFIRMATORY/DEFINITIVE, URINE, BY LC-MS/MS (PERFORMABLE)
BUPRENORPHINE: NOT DETECTED ng/mL (ref <10)
NALOXONE: NOT DETECTED ng/mL (ref <5)
NORBUPRENORPHINE: NOT DETECTED ng/mL (ref <10)
SUBOXONE INTERPRETATION: NEGATIVE

## 2020-11-22 LAB — NOTES AND COMMENTS

## 2020-11-23 LAB — CROSSMATCH RED CELLS - UNITS: UNIT DIVISION: 0

## 2020-11-23 LAB — TYPE AND SCREEN
ANTIBODY SCREEN: NEGATIVE
UNITS ORDERED: 2

## 2020-12-19 NOTE — Progress Notes (Signed)
-  patient admitted w/ reported "GI bleed". No ER course or family/MPOA contact information sent with patient. 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 Given ativan twice, intubated for airway protection. Reported to have following notable labs: K 6.8, bun 158, cr 8.8, wbc 21,000, ABG 6.8/83/72/13. Reported coffee ground emesis from gastric access. Given 2 units prbc. INR reportedly 1.24.    -arrived and noted to have anisocoria on initial exam. Abdomen tympanic. Stat ct head/c/a/p.  -vbg resulted while in ct scan: 6.87/77/65/8.4. Ordered two amps bicarbonate and walked down to CT to push immediately. BMV on way back to room.   -review images, ET tube in right mainstem. Retract 4 cm. Increased minute ventilation to compensate.  -called radiology, noted to have small bowel perf w/ extensive intra-abdominal free air.   -surgery paged and asked to come to eval immediately.  -surgery at bedside, called back soon after reporting he is too unstable and is not a surgical candidate.  -attempting to find contact information for patient via on call case mgr as well as contacting transferring facility via MARS line.   -await contact information as patient is high risk to suffer imminent cardiac arrest.    Jaynie Bream MD  Fellow, Department of Pulmonary, Critical Care and Sleep Medicine  December 14, 2020 03:31

## 2020-12-19 NOTE — Expiration Summary (Signed)
EXPIRATION SUMMARY        PATIENT NAME:  Zelig, Gacek   MRN:  [id number]  DOB:  05-03-1951    ADMISSION DATE:  11/19/2020  EXPIRATION DATE:    11/19/20    PRIMARY CARE PHYSICIAN: No Pcp     ADMISSION DIAGNOSIS:  AMS (altered mental status) [R41.82]    FINAL DIAGNOSIS:  Ischemic Bowel     Principle Problem:  Pneumatosis of intestines    Active Hospital Problems    Diagnosis Date Noted    Principle Problem: Pneumatosis of intestines [K63.89] 11/19/20    AMS (altered mental status) [R41.82] 2020/11/19    Respiratory failure (CMS HCC) [J96.90] 11-19-2020    Shock (CMS HCC) [R57.9] 2020-11-19    Pneumoperitoneum [K66.8] 11/19/2020    Lactic acidosis [E87.2] 2020-11-19    AKI (acute kidney injury) (CMS HCC) [N17.9] 2020/11/19      Resolved Hospital Problems   No resolved problems to display.     There are no active non-hospital problems to display for this patient.       Code status prior to expiration: CMO/DNR    REASON FOR HOSPITALIZATION AND HOSPITAL COURSE:  This is a 70 y.o. male with a past medical hx of methamphetamine use, who presented to [facility] with feelings of anxiety where he was treated with Ativan then intubated for airway protection. OGT was noted to have dark brown/red drainage and patient required Levophed. He was then transferred to [facility] MICU and found to be unresponsive and without protectives. CT brain negative for acute processes. CT CAP showed concern for ischemic bowel, pneumonitis, and perforation. General Surgery consulted and spoke with family regarding risks vs benefits of exploratory laparotomy. Daughter/MPOA, [name], verbalized she would not want to purse ex-lap. Patient requiring max dose four pressors and bicarb drip for shock. Lactate continued to climb. GCS remained 1-1-1; pupils unequal and fixed. Patient's family arrived to bedside and made request to transition to goals of comfort only. Patient was given medication for comfort,  compassionately extubated, and all vasopressor support was stopped.     TOD 11-19-20 15:16.     cc: Primary Care Physician:  No Pcp  No address on file    TK:PTWSFKCLE Physician:  Referring Not In System  No address on file     Lesia Sago, APRN,NP-C

## 2020-12-19 NOTE — Care Plan (Signed)
VENTILATOR - SIMV(PRVC)PS / APV-SIMV  CONTINUOUS     Discontinue     Duration: Until Specified    Priority: Routine          Question Answer Comment   FIO2 (%) 100    Peep(cm/H2O) 5    Rate(bpm) 22    Tidal Volume(mls) 500    Pressure Support(cm/H2O) 12    Indications IMPROVE DISTRIBUTION OF VENTILATION               Ref. Range 12/07/20 03:50   %FIO2 (ARTERIAL) Latest Units: % 100   PH Latest Ref Range: 7.35 - 7.45  6.92 (LL)   PCO2 Latest Ref Range: 35 - 45 mm/Hg 76 (HH)   PO2 Latest Ref Range: 72 - 100 mm/Hg 57 (L)   BICARBONATE Latest Ref Range: 18.0 - 26.0 mmol/L 10.0 (L)   BASE DEFICIT Latest Ref Range: 0.0 - 3.0 mmol/L 18.0 (H)   PAO2/FIO2 RATIO Latest Ref Range: <=200  57       Pt currently on vent (settings listed above). RT will continue to monitor.

## 2020-12-19 NOTE — Pharmacy (Signed)
[facility] Department of Pharm[facility] Services  Therapeutic Drug Monitoring: Vancomycin  12-06-2020      Patient name: [name], [name]  Date of Birth:  [birth year]    Actual Weight:        BMI:         Date RPh Current regimen (including mg/kg) Indication &  Organism AUC or trough based dosing Target Levels^ SCr (mg/dL) CrCl* (mL/min) Infectious Laboratory Markers (as applicable)   Measured level(s)   (mcg/mL) Calculated AUC (if AUC based monitoring) Plan & predicted AUC/trough if initial dosing (including when levels are due) Comments   7/1 ARS New start empiric trough  8.8 (from outside facility)  WBC: 9.1  Procal:  CRP:   Give vancomycin 1250mg  x 1 dose, placed intermittent order, follow up by primary pharmd                                                                                                    ^Target levels depends on dosing and monitoring method, AUC vs. trough based. For AUC based dosing units are mg*h/L. For trough based dosing units are mcg/mL.     *Creatinine clearance is estimated by using the Cockcroft-Gault equation for adult patients and the for pediatric patients.    The decision to discontinue vancomycin therapy will be determined by the primary service.  Please contact the pharmacist with any questions regarding this patient's medication regimen.

## 2020-12-19 NOTE — Ancillary Notes (Signed)
[facility]  Spiritual Care Note    Patient Name:  [name]  Date of Encounter:   11-22-20    Other Pertinent Information: Follow-up visit when patient's daughter and MPOA, [name] arrived with her husband.  Sister and brother-in-law also present.  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  Brother-in-law also shared stories of patient's life.  Strong family support.  I provided emotional and spiritual support.  Prayed with patient and family.  Their faith also a source of support.  Family expressed appreciation.  Spiritual support available 24/7.       2020-11-22 1402   Clinical Encounter Type   Reason for Visit Patient/Person/Family Request   Referral From Chaplain   Declined Spiritual Care Visit At This Time No   Visited With Daughter;Sister;Other (Comment)   (brother-in-law; son-in-law)   Patient Spiritual Encounters   Spiritual Needs/Issues Ritual   Family Spiritual Encounters   Spiritual Assessment Grief   Spiritual/Coping Resources Beliefs helpful in coping;Supportive family system   Interpersonal/Family Stressors Distance from home (list distance)  (4 hour drive)   End-of-Life Issues Anticipated Death   Coping  Facilitated grief;Provided supportive presence   Ritual  Prayer   Support Services Provided Non-anxious presence   Information Provided Spiritual Care scope of service   Spiritual Care Outcomes with Family   Spiritual/Emotional Processing Family shared/processed patient's story   Family Coping Coping more effectively   Time of Encounters   Start Time 1355   Stop Time 1402   Duration (minutes) 7 Minutes     Adrian Blackwater, [facility]  Pager: [phone]  Total Time of Encounter: 17 min.

## 2020-12-19 NOTE — Care Management Notes (Signed)
[facility]  Care Management Initial Evaluation    Patient Name: [name]  Date of Birth: 1950/12/18  Sex: male  Date/Time of Admission: Nov 26, 2020 12:42 AM  Room/Bed: 26/A  Payor: /   Primary Care Providers:  Pcp, No (General)    Pharmacy Info:   Preferred Pharmacy       None          Emergency Contact Info:   Extended Emergency Contact Information  Primary Emergency Contact: Bracy Pepper  Mobile Phone: [phone]  Relation: Daughter  Secondary Emergency Contact: OJAS, COONE  Mobile Phone: [phone]  Relation: Daughter    History:   JANSEL VONSTEIN is a 70 y.o., male, admitted for AMS    Height/Weight: 172.7 cm (5\' 8" ) / 69.9 kg (154 lb 1.6 oz)     LOS: 0 days   Admitting Diagnosis: AMS (altered mental status) [R41.82]    Assessment:      2020/11/26 0431   Assessment Details   Assessment Type Admission   Date of Care Management Update 11-26-20   Date of Next DCP Update 11/21/20   Readmission   Is this a readmission? No   Insurance Information/Type   Insurance type None/Self pay  (Unsure if he has insurance. Listed as self-pay.)   Comment (Other Insurance): Unsure what insurance he has if any. Currently listed as self-pay.   Employment/Financial   Patient has Prescription Coverage?  No    Financial Concerns no 01/22/21 an age group to open "lives with" row.  Adult   Lives With other (see comments)  (Unsure where he is living currently as he just got out of prison a few days ago.)   Living Arrangements other (see comments)  (Unknown)   Able to Return to Prior Arrangements other (see comments)   Living Arrangement Comments Unsure where he was living since he was released from prison a few days ago.   Care Management Plan   Discharge Planning Status initial meeting   Projected Discharge Date 11/21/20   Discharge plan discussed with: MPOA   Discharge Needs Assessment   Equipment Currently Used at Home none   Equipment Needed After Discharge other (see comments)  (TBD)    Discharge Facility/Level of Care Needs Undetermined at this time   Transportation Available ambulance;car;family or friend will provide   Referral Information   Admission Type inpatient   Address Verified verified-no changes  ([address]. Physical address unknown.)   Arrived From acute hospital, other   Acute Care Facility   [facility]   ADVANCE DIRECTIVES   Does the Patient have an Advance Directive? Yes, Patient Does Have Advance Directive for Healthcare Treatment   Document the Substance of the Advance Directive (Required) MPOA/LW   Type of Advance Directive Completed Medical Living Will;Medical Power of Attorney   Copy of Advance Directives in Chart? 0   Name of MPOA or Healthcare Surrogate Alvia Tory   Phone Number of MPOA or Healthcare Surrogate [phone]   Patient Requests Assistance in Having Advance Directive Notarized. N/A   Patient Hand-Off   Clinical/Discharge Plan of Care Information Communicated to:  Medical Social Worker;Clinical Care Coordinator   Comments Patient handoff given to MICU CMs [phone] and Jannet Askew via secure chat.   Initial assessment completed with patient's daughter/MPOA, Verdie Mosher, via phone. Per Toniann Fail, patient just got out of prison a few days ago, and she isn't sure where her father had been staying as  he did not tell her, and she isn't sure if he has any insurance and more than likely he hasn't established with a PCP since he was just released from prison. Toniann Fail stated that her father has been a substance abuser for years, so she hasn't been in contact with him much due to that. No HH history, and he wasn't using DME. MPOA received from [facility] via e-mail, and ED registration scanned it to the chart. MSW able to verify [address], but no physical address. MSW unable to verify PCP, pharmacy, or health insurance. Plan is to continue to manage in the MICU, but death is anticipated. Patient handoff given to MICU Care Managers: Jannet Askew and Verdie Mosher via secure  chat.       Discharge Plan:  Undetermined at this time    The patient will continue to be evaluated for developing discharge needs.     Case Manager: Chrisandra Carota, SOCIAL WORKER  Phone: [phone]

## 2020-12-19 NOTE — Nurses Notes (Signed)
Patient arrives to MICU 26 via health net.  Levo gtt infusing and verified.  MICU at bedside.   VS per flowsheet

## 2020-12-19 NOTE — Progress Notes (Signed)
BRIEF INTERVAL NOTE:    Patient's daughter/MPOA, [name], at bedside with additional family members. Family updated on patient's current condition and prognosis, including current requirement of max dosing of multiple vasopressors without improvement of shock state and patient's lack of protective reflexes. Toniann Fail states again she does not want to pursue any attempts of OR intervention. She and family verbalized they would like to transition to a focus of comfort measures and remove any life-sustaining support at this time. Toniann Fail verbalizes understanding expectation that patient will be medicated for comfort, then ventilator and vasopressor support will be discontinued in order to allow for patient to pass away naturally.     I spent an additional 15 minutes of critical care time with family discussing patient's care plan and prognosis.     Threasa Heads, APRN, NP-C   2020-11-23 15:01  Department of Medicine  Medical Intensive Care

## 2020-12-19 NOTE — Care Management Notes (Addendum)
[facility]  Care Management Note    Patient Name: [name]  Date of Birth: 23-Dec-1950  Sex: male  Date/Time of Admission: Dec 06, 2020 12:42 AM  Room/Bed: 26/A  Payor: /    LOS: 0 days   Primary Care Providers:  Pcp, No (General)    Admitting Diagnosis:  AMS (altered mental status) [R41.82]    Assessment:      06-Dec-2020 0402   CM Complete   Assessment Type ED   Interventions needed? Yes   Chart Reviewed Yes   Patient status? Inpatient   MSW received phone call from MICU Fellow requesting assistance with finding contact info for family as the phone number on file for patient's daughter is disconnected, and patient could code at any time. MSW reviewed chart thoroughly and found an encounter for another hospital in [place] that showed contact information for patient's daughter, Pius Byrom. Contact #s were listed as: [phone] and [phone]. MSW is unsure if those #s are accurate, but those are the only other contact numbers to be found in the chart. MSW called MICU Fellow back and gave those numbers to try. CM will continue to follow and assist as needed.     Update 0424AM: MSW received call from MICU Fellow stating that he found contact info for another daughter, Regie Bunner, and her phone number is [phone]. Per MICU Fellow, Toniann Fail stated she was patient's POA. MSW called Toniann Fail and asked for POA document to be sent to MSW. Email provided to Toniann Fail who stated she would get that emailed momentarily. Initial assessment completed with Toniann Fail via phone as well. Patient handoff given to MICU Care Managers: Jannet Askew and Verdie Mosher via Secure Chat.     Discharge Plan:  Undetermined at this time    The patient will continue to be evaluated for developing discharge needs.     Case Manager: Chrisandra Carota, SOCIAL WORKER  Phone: [phone]

## 2020-12-19 NOTE — Ancillary Notes (Signed)
[facility]  Spiritual Care Note    Patient Name:  [name]  Date of Encounter:   December 16, 2020    Other Pertinent Information: RN shared that Kerrion's family will benefit from spiritual care support. In response, patient was non-responsive, sister and brother-in-law at bed-side. She engaged in storytelling and grief processing, reflecting on her relationship with her brother over the years. She engaged in anticipatory grief processing related to  the many years of health struggles her brother has experienced. She also reflected on her grief for Benn's children, especially his daughter who is travelling 4hrs to be at his bed-side. She agreed that it will be a good idea for a chaplain visit upon the arrival of Kelvis's daughter. I informed RN about the visit and spiritual care will follow up accordingly. Spiritual care is available 24/7.     Dec 16, 2020 0935   Clinical Encounter Type   Reason for Visit Patient/Person/Family Request   Referral From RN/LPN   Visited With Patient;Sister;Other (Comment)   (brother-in-law)   Patient Spiritual Encounters   Spiritual Needs/Issues Loss of Faith or Trust in God  (unable to assess at the moment)   Family Spiritual Encounters   Spiritual Assessment Anxiety;Grief   Spiritual/Coping Resources Gratitude   Interpersonal/Family Stressors Distance from home (list distance)  (daughter is 4hrs)   End-of-Life Issues Issues related to the end of life   Coping  Facilitated grief;Provided supportive presence;Offered empathy;Facilitated story telling   Support Services Provided Non-anxious presence   Information Provided Spiritual Care scope of service   Spiritual Care Outcomes with Family   Spiritual/Emotional Processing Spiritual Care relationship established   Family Coping Coping more effectively   Time of Encounters   Start Time [phone]   Stop Time 0935   Duration (minutes) 17 Minutes           Erasmo Leventhal, [facility] RESIDENT  Pager: [phone]  Total Time of Encounter: 20 min.

## 2020-12-19 NOTE — Progress Notes (Signed)
Medical Intensive Care Unit  PROGRESS NOTE     Name: [name], 70 y.o. male Date of service: 12/04/2020   Room: 26/A  LOS: 0    MRN: [id number] Attending: Ova Freshwaterhapman, Auriana Scalia Davis, MD   CoVivi Fernsde Status: No CPR Admitted to MICU for: <principal problem not specified>     SUBJECTIVE  INTERVAL EVENTS:     Yesterday, Mr. [name] was seen at [facility] because he was "anxious feeling" and was given Ativan x 2 then intubated for airway protection. OGT was placed and had dark brown/red drainage and patient was transfused 2 PRBCs then [facility] on Levophed drip. Upon arrival, patient was unresponsive and had no protective reflexes. Overnight, the patient had CT brain (negative for acute processes) and CT CAP which revealed perforation and pneumatosis; General Surgery was consulted. This morning he is maxed on four pressors and remains unresponsive.      OBJECTIVE:    VITAL SIGNS:  Temperature: (!) 34.7 C (94.5 F) BP (Non-Invasive): 116/70 Heart Rate: (!) 136   Respiratory Rate: (!) 35 SpO2: 97 % Weight: 69.9 kg (154 lb 1.6 oz)        INTAKE & OUTPUT:  Net Since Admission:     Intake/Output Summary (Last 24 hours) at 11/29/2020 [id number]  Last data filed at 11/29/2020 0700  Gross per 24 hour   Intake 1655.74 ml   Output 655 ml   Net 1000.74 ml            VENTILATOR SETTINGS:  Ventilator Settings:  Mode: SIMV(PRVC)/PS  Set Rate: 22 Breaths Per Minute  Set PEEP: 5 cmH2O  Pressure Support: 12 cmH2O  FiO2: 100 %       CURRENT MEDICATIONS:  Current Facility-Administered Medications   Medication Dose Route Frequency Provider Last Rate Last Admin   . [START ON 11/19/2020] cefepime (MAXIPIME) 1 g in NS 50 mL IVPB  1 g Intravenous Q24H Alzahrani, Zain, MD       . D5W 1,000 mL with sodium bicarbonate 150 mEq infusion   Intravenous Continuous Orion CrookYu, Stephen, MD 150 mL/hr at 08-27-20 [id number] Solution Verified at 08-27-20 0714   . EPINEPHrine (ADRENALIN) 15 mg in NS 250 mL infusion  0.02 mcg/kg/min Intravenous Continuous Ahmed, Ameenjamal, MD  21 mL/hr at 08-27-20 0714 0.3 mcg/kg/min at 08-27-20 0714   . hydrocortisone (SOLU-CORTEF) 50 mg/mL injection  50 mg Intravenous Q6H Merril AbbeGray, Sandy Louise, APRN,NP-C       . metroNIDAZOLE (FLAGYL) 500 mg in NS 100 mL premix IVPB  500 mg Intravenous Q12H Chalmers GuestAlzahrani, Zain, MD   Stopped at 08-27-20 0323   . norepinephrine (LEVOPHED) 16 mg in NS 250mL premix infusion  0.03 mcg/kg/min Intravenous Continuous Alzahrani, Zain, MD 19.66 mL/hr at 08-27-20 0714 0.3 mcg/kg/min at 08-27-20 0714   . NS bolus infusion 40 mL  40 mL Intravenous Once PRN Alzahrani, Zain, MD       . NS flush syringe  2-6 mL Intracatheter Q8HRS Alzahrani, Zain, MD   6 mL at 08-27-20 0600    And   . NS flush syringe  2-6 mL Intracatheter Q1 MIN PRN Alzahrani, Zain, MD       . pantoprazole (PROTONIX) injection  40 mg Intravenous Q12H Alzahrani, Zain, MD        And   . Melene Muller[START ON 11/21/2020] pantoprazole (PROTONIX) delayed release tablet  40 mg Oral Q12H Alzahrani, Zain, MD       . perflutren lipid microspheres (DEFINITY) 1.3 mL in NS 10 mL (  tot vol) injection  2 mL Intravenous Give in Cardiology Chalmers Guest, MD       . phenylephrine (NEO-SYNEPHRINE) 50 mg in NS 250 mL infusion  0.5 mcg/kg/min Intravenous Continuous Alzahrani, Zain, MD 104.9 mL/hr at Dec 04, 2020 0836 5 mcg/kg/min at Dec 04, 2020 0836   . Vancomycin IV - Pharmacist to Dose per Protocol   Does not apply Daily PRN Alzahrani, Zain, MD       . Vancomycin IV Intermittent Dosing   Does not apply Daily PRN Alzahrani, Zain, MD       . vasopressin (VASOSTRICT) 20 units in 100 mL (0.2 units/mL) premix infusion (GI BLEED)  0.1 Units/min Intravenous Continuous Alzahrani, Zain, MD 30 mL/hr at 12-04-2020 0714 0.1 Units/min at 2020-12-04 0714        LINES & DRAINS:   Patient Lines/Drains/Airways Status     Active Line / Dialysis Catheter / Dialysis Graft / Drain / Airway / Wound     Name Placement date Placement time Site Days    Peripheral IV Right Basilic  (medial side of arm) 04-Dec-2020  0051  -- less than 1     Arterial Line Right Radial 2020-12-04  0400  Radial  less than 1    Central Triple Lumen Right Femoral 11/17/20  --  -- 1    NasoGastric Tube Right 11/17/20  1900  -- less than 1    Foley Catheter 11/17/20  1900  -- less than 1    EndoTracheal Tube 7.5 11/17/20  1900  -- less than 1                  DAILY PHYSICAL EXAMINATION:  General: Appears acutely and chronically ill; older than stated age  Eyes: right pupil 4, left pupil 3; fixed, non-reactive   HENT: MM pink and moist. Trachea midline   Lungs: Coarse, diminished bilaterally  Cardiovascular: tachycardic, RRR, distal pulses weak, but palpable; cap refill >3sec  Abdomen: distended, taut, tympanic; NGT to suction w/coffee ground drainage   Extremities: Mottled to all four extremities; no edema  Integumentary: Skin warm and dry, No rashes  Neurologic: GCS 1-1-1; no cough, corneal, or gag reflex        LABS  IMAGING  MICROBIOLOGY  PATHOLOGY:     I have reviewed all of the recent labs, imaging, microbiology and pathology.    CBC Differential   Recent Labs     December 04, 2020  0049 12/04/20  0610   WBC 9.1  --    HGB 12.7* 11.9*   HCT 37.8* 36.5*   PLTCNT 255  --     Recent Labs     2020/12/04  0049   PMNS 84   LYMPHOCYTES 11   MONOCYTES 4   EOSINOPHIL 1   BASOPHILS 0  <0.10   PMNABS 7.64   MONOSABS 0.36   EOSABS <0.10      BMP LFTs   Recent Labs     12-04-2020  0049   SODIUM 139   POTASSIUM 6.0*   CHLORIDE 100   CO2 13*   BUN 133*   CREATININE 7.04*   GLUCOSENF 74   ANIONGAP 26*   BUNCRRATIO 19   GFR 8*   CALCIUM 6.7*   MAGNESIUM 3.0*   PHOSPHORUS 16.3*    Recent Labs     12-04-2020  0049 12/04/20  0210   TOTALPROTEIN 5.8*  --    TOTBILIRUBIN 1.9*  --    UROBILINOGEN  --  Negative   AST 1,474*  --  ALT 468*  --    ALKPHOS 113  --    GAMMAGT 30  --    AMMONIA 84*  --       CoAgs Blood Gas:   Recent Labs     2020-12-04  0049   PROTHROMTME 16.8*   INR 1.45*   APTT 33.1    Recent Labs     04-Dec-2020  0102 12-04-2020  0350   FI02 100.0 100   PH 6.87* 6.92*   PCO2 77* 76*   PO2 65* 57*    BICARBONATE 8.4* 10.0*   BASEDEFICIT 20.3* 18.0*       Cardiac Markers Lipid Panel   Recent Labs     04-Dec-2020  0049   TROPONINI 48*    No results found for this encounter   Urine Analysis Other Labs   Recent Labs     12/04/2020  0210   APPEARANCE Cloudy*   COLOR Normal (Yellow)   SPECGRAVUR 1.019   GLUCOSE Negative   BILIRUBIN Negative   KETONES Negative   PHURINE 5.0   PROTEIN 100 *   UROBILINOGEN Negative   NITRITE Negative   LEUKOCYTES Negative   RBCS >182.0*   WBCS 8.0*   BACTERIA Occasional or less    Recent Labs     2020-12-04  0049   TSH 0.027*        IMAGING STUDIES:      XR AP MOBILE CHEST   Final Result by Edi, Radresults In (07/01 0504)   Overall unchanged aeration with support devices as above.      CT CHEST ABDOMEN PELVIS WO IV CONTRAST   ED Interpretation by Phebe Colla, MD (07/01 0225)   Findings concerning for hollow viscus perforation in the setting of ischemic bowel changes. There is marked gaseous distention of multiple small bowel loops throughout the abdomen with extensive pneumatosis of the bowel wall. Foci of suspected free intra-abdominal air are seen along the anterior abdominal wall, for example series 4, image 45 and series 14, image 45. No transition point identified.    No discrete evidence of intra-abdominal hemorrhage. There is simple fluid attenuation ascites layering dependently within the pelvis.    Endotracheal tube with tip within the proximal right mainstem bronchus, recommend mild retraction.    Extensive bibasilar airspace consolidation with diffuse groundglass opacities, concerning for multifocal infectious process.    Findings discussed with Dr. Tasia Catchings MICU fellow by Dr. Elvia Collum at 2:22 AM.      CT BRAIN WO IV CONTRAST   ED Interpretation by Phebe Colla, MD (07/01 0202)   No acute intracranial process.      XR AP MOBILE CHEST   Final Result by Edi, Radresults In (07/01 0213)   1.Mild pulmonary edema pattern with suspected small bilateral pleural  effusions.   2.Supportive devices as above.         XR ABD X-RAY CHECK DOBHOFF PLACEMENT   Final Result by Edi, Radresults In (07/01 0848)   The enteric tube terminates in the gastric body. There is abnormal gaseous distention of multiple loops of central small bowel with extensive pneumatosis. Findings are better assessed on the subsequently performed CT.              MICRO RESULTS:     No results found for any visits on 2020/12/04 (from the past 24 hour(s)).    BLOOD GAS  Lab Results   Component Value Date    PH 6.92 (LL) 12-04-2020  PCO2 76 (HH) 11/26/20    PO2 57 (L) 11/26/2020    BICARBONATE 10.0 (L) November 26, 2020            ASSESSMENT:     [name] is a 70 y.o. male with a past medical hx of methamphetamine use, who presented to [facility] with feelings of anxiety where he was treated with Ativan then intubated for airway protection. OGT was noted to have dark brown/red drainage and patient required Levophed. He was then transferred to [facility] MICU and found to be unresponsive and without protectives. CT brain negative for acute processes. CT CAP shows perforation and pneumonitis. General Surgery consulted. Requiring max dose four pressors and bicarb drip.      Active Hospital Problems    Diagnosis   . Primary Problem: Pneumatosis of intestines   . AMS (altered mental status)   . Respiratory failure (CMS HCC)   . Shock (CMS HCC)   . Pneumoperitoneum   . Lactic acidosis   . AKI (acute kidney injury) (CMS HCC)         PLAN:     Neurologic:  Acute encephalopathy   - No sedation  - CT brain negative for acute concerns  - No cough, gag, cornea reflexes  - Pupils unequal and fixed     Pulmonary:  Acute hypoxic hypercarbic respiratory failure   - Ventilator Settings:  Mode: SIMV(PRVC)/PS  Set Rate: 22 Breaths Per Minute  Set PEEP: 5 cmH2O  Pressure Support: 12 cmH2O  FiO2: 100 %   - Bicarb given for continued acidosis     Cardiovascular:  Profound shock  Tachycardia   - MAP goal >65  - Requiring max dose Levo, Epi,  Neo-Synephrine, and Vasopressin gtts   - Bicarb gtt  - Hydrocortisone 50 mg Q6 hr    GI/FEN:  Extensive pneumatosis and pneumoperitoneum  Lactic acidosis  Hyperkalemia  HAGMA  Transaminitis   - General surgery consulted for ischemic bowel; appreciate rec's    - Dr Rubye Oaks spoke with family regarding risk vs benefit of ex-lap   - Family declining surgical intervention   - NGT to LIS w/coffee ground drainage    - Lactate 7.1; reflex lactic acid 9.5  - Potassium 6.0, treated with Dextrose, insulin, calcium; repeat 5.3  - Anion gap 26     - AST 1474, ALT 468    Renal:  AKI   - Creatinine 7.04; baseline unknown   - Strict I/O, maintain foley  - Renally dose all medications and avoid Nephrotoxins   - urine drug screen +amphetamines, buprenorphine, opiates, oxycodone      Hematology:  Intrinsic Anemia  GI bleed  - Hgb 12.7 on admission, now 10.6  - NGT to LIS with noted dark brown/coffee ground drainage   - Transfuse to maintain Hgb > 7   - received 2 PRBC at outside facility prior to transfer     Infectious Diseases:  - WBC 9.1 on admission  - Blood cultures pending   - Continued need for four pressor support   - Continue Vanc/cefepime/flagyl for broad spectrum coverage    Antibiotic Course:   Antibiotics (From admission, onward)    Start     Stop Route Frequency    11/19/20 0200  cefepime (MAXIPIME) 1 g in NS 50 mL IVPB  (ceFEPime (MAXIPIME) IVPB load & maintenance dose)         -- IV EVERY 24 HOURS    11-26-20 0300  metroNIDAZOLE (FLAGYL) 500 mg in NS 100  mL premix IVPB         -- IV EVERY 12 HOURS    12/11/2020 0300  vancomycin (VANCOCIN) 1,250 mg in NS 250 mL IVPB         07/01 0455 IV ONCE    12-11-2020 0200  cefepime (MAXIPIME) 2 g in NS 100 mL IVPB  (ceFEPime (MAXIPIME) IVPB load & maintenance dose)         07/01 0238 IV NOW    2020/12/11 0152  Vancomycin IV Intermittent Dosing         -- N/A DAILY PRN          Psych/Social:  Illicit drug use   - History of IVDU   - Per family, patient has been using prescription and  non-prescription medications for 30 years due to back injury  ___    Diet: NPO  Fluids: BiCarb gtt  Analgesia: None  Sedation: None  DVT/PE Prophylaxis: SCDs/ Venodynes/Impulse boots  Ulcer Prophylaxis: Pantoprazole   Glycemic Control: None indicated   Bowel Regimen: None    Consults: general surgery  Hardware (Lines, Drains, Foley, Tubes): PIV, Femoral Central Catheter, NGT, ETT, Foley Catheter and arterial line  Activity: Up w/ assistance and Up in chair TID w/ all meals  Therapy: None    Family communication/Prognosis:  Sister and brother-in-law at bedside and updated about patient's current condition and prognosis. Daughter/MPOA, Toniann Fail, en route and will be updated upon arrival due to poor cellular service.     Prognosis is poor.     Disposition Planning - TBD    I independently of the faculty provider spent a total of 35 minutes in direct/indirect care of this patient including initial evaluation, review of laboratory, radiology, diagnostic studies, review of medical record, order entry and coordination of care as part of a shared visit in the critical care setting with Dr. Kirstie Mirza.       Threasa Heads, APRN, NP-C   2020/12/11 08:28  Department of Medicine  Medical Intensive Care         I saw and examined the patient.  I reviewed the advanced practice provider's note.  I agree with the findings and plan of care as documented in the advanced practice provider's note.  Any exceptions/additions are edited/noted.    See H&P from earlier today, July 1st, for my critical care addendum.    Karn Cassis Sibyl Parr, MD  14:49 December 11, 2020   Assistant Professor  Pulmonary/Critical Care  [facility] [employer]

## 2020-12-19 NOTE — Consults (Signed)
[facility]  Surgery Initial Consult    Juanmiguel, Defelice [place], 70 y.o. male  Date of Birth:  [birth year]  Date of service: Nov 29, 2020  Encounter Start Date: 11/29/20  Inpatient Admission Date:  11-29-20    Information Obtained from: patient  Chief Complaint: AMS    PCP: No Pcp  Consult Requested By: MICU     HPI: (must include no less than 4 of the following main descriptors) Location (of pain): Quality (character of pain) Severity (minimal, mild, severe, scale or 1-10) Duration (how long has pain/sx present) Timing (when does pain/sx occur)  Context (activity at/before onset) Modifying Factors (what makes pain/sx  Better/worse) Associate Sign/Sx (what accompanies main pain/sx)     CORI HENNINGSEN is a 70 y.o., White male with hx of Meth use who presented to [facility] ED with "anxious feeling". Per report the patient was given ativan and intubated for airway protection. OGT was placed and dark brown/red fluid was suctioned from his stomach. The patient was transferred to MICU for higher level of care. Upon arrival the patient had CT brain which was unremarkable and CT CAP which was concerning for perforation and pneumatosis. Lactate 7.1. pH 6.87 on vBG with bicarb of 8.4. Upon arrival to MICU he did not have corneal reflexes or gag reflexes. Per MICU, the patient did not have any recent sedation/paralyzation.      ROS:  MUST comment on all "Abnormal" findings   ROS Review of systems was not obtained due to AMS .    PAST MEDICAL/ FAMILY/ SOCIAL HISTORY:     No past medical history on file.  Past Medical History was reviewed and is negative for unable to obtain due to AMS    Not on File  Medications Prior to Admission     None         cefepime (MAXIPIME) 2 g in NS 100 mL IVPB, 2 g, Intravenous, Q12H  electrolyte-A (PLASMALYTE-A) bolus infusion 1,000 mL, 1,000 mL, Intravenous, Once  metroNIDAZOLE (FLAGYL) 500 mg in NS 100 mL premix IVPB, 500 mg, Intravenous, Q12H  norepinephrine (LEVOPHED) 16 mg in NS 225m premix infusion,  0.03 mcg/kg/min, Intravenous, Continuous  NS bolus infusion 40 mL, 40 mL, Intravenous, Once PRN  NS flush syringe, 2-6 mL, Intracatheter, Q8HRS   And  NS flush syringe, 2-6 mL, Intracatheter, Q1 MIN PRN  pantoprazole (PROTONIX) injection, 40 mg, Intravenous, Q12H   And  [START ON 11/21/2020] pantoprazole (PROTONIX) delayed release tablet, 40 mg, Oral, Q12H  perflutren lipid microspheres (DEFINITY) 1.3 mL in NS 10 mL (tot vol) injection, 2 mL, Intravenous, Give in Cardiology  vancomycin (VANCOCIN) 1,250 mg in NS 250 mL IVPB, 1,250 mg, Intravenous, Once  Vancomycin IV - Pharmacist to Dose per Protocol, , Does not apply, Daily PRN  Vancomycin IV Intermittent Dosing, , Does not apply, Daily PRN  vasopressin (VASOSTRICT) 20 units in 100 mL (0.2 units/mL) premix infusion (GI BLEED), 0.1 Units/min, Intravenous, Continuous      Past Surgical History was reviewed and is negative for AMS      Family History:    Family Medical History:    None              PHYSICAL EXAMINATION: MUST comment on all "Abnormal" findings    Exam Heart Rate: (!) 106  BP (Non-Invasive): 132/61  Respiratory Rate: (!) 48  SpO2: 100 %  Constitutional: acutely ill  Eyes: abnormal findings: pupils asymmetrically dilated, fixed, no corneal reflexes  ENT: ETT in place, no  gag reflex with suction  Neck: supple, symmetrical, trachea midline  Respiratory: ventilated via ETT  Cardiovascular:    tachycardic, regular rhythm  Gastrointestinal: soft, distended, no surgical scars  Genitourinary: Deferred  Musculoskeletal: all 4 extremities present  Integumentary:  skin cool and dry  Neurologic: GCS 3T  Psychiatric: Unable to assess    Labs Ordered/ Reviewed (Please indicate ordered or reviewed)   Reviewed: Labs:  Lab Results Today:    Results for orders placed or performed during the hospital encounter of 11-24-2020 (from the past 24 hour(s))   BASIC METABOLIC PANEL   Result Value Ref Range    SODIUM 139 136 - 145 mmol/L    POTASSIUM 6.0 (HH) 3.5 - 5.1 mmol/L     CHLORIDE 100 96 - 111 mmol/L    CO2 TOTAL 13 (L) 23 - 31 mmol/L    ANION GAP 26 (H) 4 - 13 mmol/L    CALCIUM 6.7 (L) 8.8 - 10.2 mg/dL    GLUCOSE 74 65 - 125 mg/dL    BUN 133 (H) 8 - 25 mg/dL    CREATININE 7.04 (H) 0.75 - 1.35 mg/dL    BUN/CREA RATIO 19 6 - 22    ESTIMATED GFR 8 (L) >=60 mL/min/BSA   PHOSPHORUS   Result Value Ref Range    PHOSPHORUS 16.3 (H) 2.3 - 4.0 mg/dL   MAGNESIUM   Result Value Ref Range    MAGNESIUM 3.0 (H) 1.8 - 2.6 mg/dL   PT/INR   Result Value Ref Range    PROTHROMBIN TIME 16.8 (H) 9.1 - 13.9 seconds    INR 1.45 (H) 0.80 - 1.20   ALT (SGPT)   Result Value Ref Range    ALT (SGPT) 468 (H) 10 - 55 U/L   AST (SGOT)   Result Value Ref Range    AST (SGOT)  1,474 (H) 8 - 45 U/L   BILIRUBIN TOTAL   Result Value Ref Range    BILIRUBIN TOTAL 1.9 (H) 0.3 - 1.3 mg/dL   ALK PHOS (ALKALINE PHOSPHATASE)   Result Value Ref Range    ALKALINE PHOSPHATASE 113 45 - 115 U/L   GAMMA GT   Result Value Ref Range    GGT 30 7 - 50 U/L   PROTEIN TOTAL   Result Value Ref Range    PROTEIN TOTAL 5.8 (L) 6.0 - 8.0 g/dL   TYPE AND SCREEN   Result Value Ref Range    UNITS ORDERED 2     ABO/RH(D) O POSITIVE     ANTIBODY SCREEN NEGATIVE     SPECIMEN EXPIRATION DATE 11/21/2020,2359    TROPONIN-I   Result Value Ref Range    TROPONIN I 48 (H) 0 - 30 ng/L   AMMONIA   Result Value Ref Range    AMMONIA 84 (H) 15 - 50 umol/L   CBC WITH DIFF   Result Value Ref Range    WBC 9.1 3.7 - 11.0 x10^3/uL    RBC 4.22 (L) 4.50 - 6.10 x10^6/uL    HGB 12.7 (L) 13.4 - 17.5 g/dL    HCT 37.8 (L) 38.9 - 52.0 %    MCV 89.6 78.0 - 100.0 fL    MCH 30.1 26.0 - 32.0 pg    MCHC 33.6 31.0 - 35.5 g/dL    RDW-CV 15.1 11.5 - 15.5 %    PLATELETS 255 150 - 400 x10^3/uL    MPV 11.4 8.7 - 12.5 fL   PTT (PARTIAL THROMBOPLASTIN TIME)   Result Value Ref  Range    APTT 33.1 24.2 - 37.5 seconds   CROSSMATCH RED CELLS - UNITS , 2 Units   Result Value Ref Range    Coding System ISBT128     UNIT NUMBER [id number]     BLOOD COMPONENT TYPE LR RBC, Adsol1, 04710     UNIT  DIVISION 00     UNIT DISPENSE STATUS ALLOCATED     TRANSFUSION STATUS OK TO TRANSFUSE     IS CROSSMATCH Electronically Compatible     Product Code [phone]     Coding System ISBT128     UNIT NUMBER [id number]     BLOOD COMPONENT TYPE LR RBC, Adsol1, 04710     UNIT DIVISION 00     UNIT DISPENSE STATUS ALLOCATED     TRANSFUSION STATUS OK TO TRANSFUSE     IS CROSSMATCH Electronically Compatible     Product Code [id number]    MANUAL DIFF AND MORPHOLOGY-SYSMEX   Result Value Ref Range    NEUTROPHIL % 84 %    LYMPHOCYTE %  11 %    MONOCYTE % 4 %    EOSINOPHIL % 1 %    BASOPHIL % 0 %    NEUTROPHIL # 7.64 1.50 - 7.70 x10^3/uL    LYMPHOCYTE # 1.00 1.00 - 4.80 x10^3/uL    MONOCYTE # 0.36 0.20 - 1.10 x10^3/uL    EOSINOPHIL # <0.10 <=0.50 x10^3/uL    BASOPHIL # <0.10 <=0.20 x10^3/uL    NRBC FROM MANUAL DIFF 3 per 100 WBC    TOXIC GRANULATION Present (A) None   VENOUS BLOOD GAS WITH LACTATE REFLEX   Result Value Ref Range    %FIO2 (VENOUS) 100.0 %    PH (VENOUS) 6.87 (LL) 7.31 - 7.41    PCO2 (VENOUS) 77 (HH) 41 - 51 mm/Hg    PO2 (VENOUS) 65 (H) 35 - 50 mm/Hg    BASE DEFICIT 20.3 (H) -3.0 - 3.0 mmol/L    BICARBONATE (VENOUS) 8.4 (L) 22.0 - 26.0 mmol/L    LACTATE 7.1 (H) 0.0 - 1.3 mmol/L   ABO&RH CONFIRMATION   Result Value Ref Range    ABO/RH(D) O POSITIVE    TEG, RAPID GLOBAL WITH LYSIS (TRAUMA)   Result Value Ref Range    R (CK) 8.7 4.6 - 9.1 min    LYS30 (%) 0.0 0.0 - 2.6 %    MA (CRT RAPID) 69.3 52.0 - 70.0 mm    MA FIBRINOGEN (CFF) 31.0 15.0 - 32.0 mm   PRODUCT: FFP/PLASMA - UNITS : 2 Units   Result Value Ref Range    Coding System ISBT128     UNIT NUMBER [id number]     BLOOD COMPONENT TYPE THAWED PLASMA     UNIT DIVISION 00     UNIT DISPENSE STATUS ALLOCATED     TRANSFUSION STATUS OK TO TRANSFUSE     Product Code [id number]     Coding System ISBT128     UNIT NUMBER [id number]     BLOOD COMPONENT TYPE THAWED PLASMA     UNIT DIVISION 00     UNIT DISPENSE STATUS ALLOCATED     TRANSFUSION STATUS OK TO TRANSFUSE     Product  Code A1937T02    URINALYSIS, MACROSCOPIC   Result Value Ref Range    SPECIFIC GRAVITY 1.019 1.005 - 1.030    GLUCOSE Negative Negative mg/dL    PROTEIN 100  (A) Negative mg/dL    BILIRUBIN Negative Negative mg/dL    UROBILINOGEN Negative Negative mg/dL  PH 5.0 5.0 - 8.0    BLOOD Large (A) Negative mg/dL    KETONES Negative Negative mg/dL    NITRITE Negative Negative    LEUKOCYTES Negative Negative WBCs/uL    APPEARANCE Cloudy (A) Clear    COLOR Normal (Yellow) Normal (Yellow)   URINALYSIS, MICROSCOPIC   Result Value Ref Range    WBCS 8.0 (H) <4.0 /hpf    RBCS >182.0 (H) <6.0 /hpf    BACTERIA Occasional or less Occasional or less /hpf    AMORPHOUS SEDIMENT Light Light /hpf    HYALINE CASTS 41.0 (H) <4.0 /lpf    GRANULAR CASTS 14 (H) <=0 /lpf    SQUAMOUS EPITHELIAL CELLS Occasional or less Occasional or less /lpf    TRANSITIONAL EPITHELIAL Few (A) Occasional or less /lpf    MUCOUS Light Light /lpf   DRUG SCREEN, WITH CONFIRMATION, URINE   Result Value Ref Range    AMPHETAMINES, URINE Positive (A) Negative    BARBITURATES URINE Negative Negative    BENZODIAZEPINES URINE Negative Negative    BUPRENORPHINE URINE Positive (A) Negative    CANNABINOIDS URINE Negative Negative    COCAINE METABOLITES URINE Negative Negative    METHADONE URINE Negative Negative    OPIATES URINE (LOW CUTOFF) Positive (A) Negative    OXYCODONE URINE Positive (A) Negative    ECSTASY/MDMA URINE Negative Negative    FENTANYL, RANDOM URINE Negative Negative    CREATININE RANDOM URINE 118 (H) 50 - 100 mg/dL         Radiology Tests Ordered/ Reviewed (Please indicate ordered or reviewed)   Reviewed:CT: AP  Extensive pneumatosis with pneumoperitoneum      IMPRESSION:    65M with AMS, no corneal or gag reflexes, and CT with extensive pneumatosis and pneumoperitoneum.    Recommendations (if Consult)   - with current patient presentation and CT findings of extensive pneumatosis, surgery is not likely to change clinical outcome  - will defer continued  management to MICU team   - please page general surgery blue with questions    Kathee Delton, MD      Late entry for November 21, 2020. I saw and examined the patient.  I reviewed the resident's note.  I agree with the findings and plan of care as documented in the resident's note.  Any exceptions/additions are edited/noted.    Lydia Guiles, MD

## 2020-12-19 NOTE — Procedures (Signed)
PROCEDURE NOTE  Arterial Line Insertion     Procedure Date: 2020-12-15 Time: 4:00 AM  Procedure: Arterial Line Placement  Indication: invasive BP monitoring    Description: Because of an urgent/emergent situation, informed consent was not obtained. The skin was prepped with chlorhexidine and  1% lidocaine anelgesia was used. A catheter was inserted in the right Radial artery using the Seldinger Technigue. Good arterial blood was noted and then the line was connected to the transducer showing arterial wave forms. The line was sutured in place and dressed. Patient tolerated procedure without complications.     Orion Crook, MD  PGY-3, Internal Medicine  [facility]  12-15-2020  06:28    I was available to supervise the entire procedure.    Karn Cassis Sibyl Parr, MD  08:00 2020-12-15   Assistant Professor  Pulmonary/Critical Care   [employer]

## 2020-12-19 NOTE — Progress Notes (Signed)
[facility]  Expired Patient Note    Name:[name]  OMA:[id number]  Date of Service:December 10, 2020  Date of Birth:10-10-1950    Paged by patient's nurse that patient had expired.    Family at bedside, including: Daughter/MPOA [name]), son-in-law, sister, and brother-in-law.     On exam:  -No chest wall motion.  -No breaths sounds bilaterally on auscultation.   -No heart sounds on auscultation.  -No radial/peripheral pulses bilaterally.  -No withdrawal from painful stimuli.  -No corneal reflexes.  -No pupillary reflex.     Time and Date of death: 12-10-20 15:16   Cause of death: Ischemic bowel   Medical examiner notifed: Yes - at this time, awaiting return call from pathologist to determine if patient is ME case.     - Family declines autopsy.       Threasa Heads, APRN, NP-C   Dec 10, 2020 15:09  Department of Medicine  Medical Intensive Care

## 2020-12-19 NOTE — H&P (Signed)
Medical Intensive Care Unit  HISTORY & PHYSICAL     Name: [name], [name], 70 y.o. male Date of Service:  2020/12/03   MRN  Date of Birth:  January 08, 1951  [id number] Attending: Lorin Glass, MD   PCP: No Pcp Chief Complaint: had no chief complaint listed for this encounter.   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 Per report the patient was given ativan and intubated for airway protection. OGT was placed and dark brown/red fluid was suctioned from his stomach. The patient was transferred to MICU for higher level of care. Upon arrival the patient had CT brain which was unremarkable and CT CAP which was concerning for perforation and pneumatosis. Lactate 7.1. pH 6.87 on vBG with bicarb of 8.4. Upon arrival to MICU he did not have corneal reflexes or gag reflexes.      MEDICAL HISTORY:     PAST MEDICAL & SURGICAL HISTORIES:   No past medical history on file. No past surgical history on file.   HOME MEDICATIONS:  No outpatient medications have been marked as taking for the Dec 03, 2020 encounter [facility] Encounter).      ALLERGIES:  He has no allergies on file.      FAMILY HISTORY:  His family history is not on file.   SOCIAL HISTORY:  He  has no history on file for alcohol use. He  has no history on file for tobacco use. He  has no history on file for drug use.     ROS:      Unable to obtain 2/2 AMS/Intubation     OBJECTIVE:    VITAL SIGNS:    BP (Non-Invasive): 132/61 Heart Rate: (!) 106   Respiratory Rate: (!) 48 SpO2: 100 %          INTAKE & OUTPUT:  Net Since Admission:   No intake or output data in the 24 hours ending 03-Dec-2020 0212            VENTILATOR SETTINGS:  Conventional settings:  Mode: SIMV(PRVC)/PS  Set VT: 500 mL  Set Rate: 22 Breaths Per Minute  Set PEEP: 5 cmH2O  Pressure Support: 12 cmH2O  FiO2: 100 %        LINES & DRAINS:    Patient Lines/Drains/Airways Status     Active Line / Dialysis Catheter / Dialysis Graft / Drain / Airway / Wound     Name Placement date Placement time Site Days    Peripheral IV Right Basilic  (medial side of arm) Dec 03, 2020  0051  -- less than 1    Central Triple Lumen Right Femoral 11/17/20  --  -- 1                 DAILY EXAMINATION:  General: chronically ill appearing male, intubated   Head: NC/AT  Eyes: right pupil dilated approximally 69mm and left pupil 14mm   Throat: Oropharynx clear, mucous membranes moist.  Neck: Trachea midline.  Cardiovascular: RRR, no murmurs, rubs, gallops.  Respiratory: CTABL, no wheezes, rhonchi, crackles.  Abdominal: BS normal; abdomen soft, non-tender to palpation  Extremities: No edema. DPP 2+ bilaterally. diffuse intermittent muscles twitching   Skin: Warm and dry.  Neurological: intubated    Glasgow Coma Scale Score: 3    LABS  IMAGING  MICROBIOLOGY  PATHOLOGY:  I have reviewed all of the recent labs, imaging, microbiology and pathology.    CBC Differential   Recent Labs     12-08-2020  0049   WBC 9.1   HGB 12.7*   HCT 37.8*   PLTCNT 255    Recent Labs     12-08-20  0049   PMNS 84   LYMPHOCYTES 11   MONOCYTES 4   EOSINOPHIL 1   BASOPHILS 0  <0.10   PMNABS 7.64   MONOSABS 0.36   EOSABS <0.10      BMP LFTs   Recent Labs     Dec 08, 2020  0049   MAGNESIUM 3.0*   PHOSPHORUS 16.3*    Recent Labs     December 08, 2020  0049   TOTALPROTEIN 5.8*   TOTBILIRUBIN 1.9*   AST 1,474*   ALT 468*   ALKPHOS 113   GAMMAGT 30   AMMONIA 84*      CoAgs Blood Gas:   Recent Labs     12-08-2020  0049   PROTHROMTME 16.8*   INR 1.45*   APTT 33.1    Recent Labs     12/08/20  0102   FI02 100.0   PH 6.87*   PCO2 77*   PO2 65*   BICARBONATE 8.4*   BASEDEFICIT 20.3*       Cardiac Markers Lipid Panel   No results for input(s): TROPONINI, CKMB, MBINDEX, BNP in the last 72 hours. 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 Per report the patient was given ativan and intubated for airway protection.        PLAN:   Acute encephalopathy Hx of Meth use    Most likely metabolic related    BMP showed hyperkalemia    ABG with respiratory acidosis, metabolic acidosis and lactic acidosis    Urine drug screening positive for buprenorphine, amphetamine, Opiate and oxycodone    Ammonia 84, PT 16.8 INR: 1.45, ALT: 468, AST: 1,474 bilirubin 1.9     Two units  of FFP ordered    Start PPI per GI bleed protocol    Hepatitis panel ordered     Profound shock oligo-anuric AKI lactic acidosis    baseline creatinine unclear    Start levo, epi and vaso to maintain MAP>65    Start bicarb gtt       Extensive pneumatosis and pneumoperitoneum   CT C/A/P as mention above    General surgery consulted, appreciate recs          Prophylaxis:   DVT: none risk of bleed    GI: Proton Pump inhibitor   Analgesia: None    Antimicrobial: Vancomycin, Cefepime, flagyl    Nutrition: NPO   Therapy: OT and PT   Sedation: none      Family communication/Prognosis:   See separated note     Disposition Planning - TBD  Chalmers Guest, MD  12-08-20, 02:12  PGY-3  Internal Medicine      Critical Care Time:  Total critical care time spent in direct care of this patient at high risk based on presenting history/exam/and complaint,  including initial evaluation and stabilization, review of data, re-examination, discussion with admitting and consulting services to arrange definitive care, and exclusive of any procedures performed, was 105 minutes.    Admitted in the early hours of morning.  Actively dying.  Refractory shock, on all available agents at this hospital.  Stress dose steroids.  Broad-spectrum antibiotics.  Bowel perforation, catastrophic  intra-abdominal failure.  Would not survive surgical intervention, appreciate goals of care discussion from surgery team.  Hypothermia.  Severe metabolic and hypercapnic acidosis.  Renal failure.  Continue bicarbonate infusion.  Increase ventilator support to better balance pH.  Respiratory failure requiring mechanical ventilation.  Shock liver.  Failing heart.  DNR.  Plan to stop vasopressors and withdrawal mechanical ventilation once family has had time to visit (will transition to comfort measures only).  Due to short time in the hospital with expected death, will need to notify medical examiner.    I saw and examined the patient.  I reviewed the resident's note.  I agree with the findings and plan of care as documented in the resident's note.  Any exceptions/additions are edited/noted.    Karn Cassis Sibyl Parr, MD  10:55 Dec 12, 2020   Assistant Professor  Pulmonary/Critical Care   [facility] [employer]

## 2020-12-19 NOTE — Progress Notes (Signed)
Discussion with family had over the phone by Dr. Rubye Oaks    Given patient's current condition family has elected not to pursue an exploratory laparotomy.    They are on their way and will have discussions about comfort care once they arrive.    Michaelle Birks, MD  2020/12/08, 07:25    I had a long discussion with the patient's daughter Toniann Fail regarding his current medical condition.  Given the appearance of his CT scan there is significant concern for ischemic bowel with pneumatosis and possible perforation.  In addition, he is critically ill requiring multiple vasopressors, in acute renal failure and liver dysfunction.  We discussed surgical options including exploration with possible bowel resection and that this would likely result in multiple operative interventions, possible ostomy and feeding tube.  Additionally we discussed that if we performed an exploration and the entirety of the bowel was non-viable, that would be a lethal insult.  We also discussed doing no intervention with the understanding that if his bowel was unhealthy this would progress and lead to worsening sepsis and death.  We discussed that likely if intervention was pursued he would likely require continued ventilatory support and dialysis given the degree of his sepsis and renal dysfunction.  His daughter asked for a few minutes to think about her decision and would let us know.      I called the daughter back and she stated that she did not want to proceed with surgical intervention.  She does not think it would be within her fathers wishes to pursue these aggressive measures.  She is on her way in to the hospital and at that time would like to discuss options to make her father comfortable if his condition continues to decline.  She is approximately 5 hours away and we discussed that in that time his condition may worsen and progress to death.  She understands and just asked that we contact her with any changes to her father's condition.  We  discussed this with the MICU team.    Cherly Hensen, MD  08-Dec-2020, 12:23

## 2020-12-19 NOTE — Nurses Notes (Signed)
Post Mortem Note  TOD: 1516  Chaplain declined  MD Notified: Merril Abbe APRN-NP  Family Notified: at bedside  Belongings: given to: Daughter-Wendy      Clent Demark, RN

## 2020-12-19 DEATH — deceased
